# Patient Record
Sex: Male | Born: 1975 | Race: Black or African American | Hispanic: No | Marital: Married | Smoking: Never smoker
Health system: Southern US, Community
[De-identification: ages and names within clinical notes are randomized; demographics above are authoritative.]

## PROBLEM LIST (undated history)

## (undated) DIAGNOSIS — I10 Essential (primary) hypertension: Secondary | ICD-10-CM

## (undated) DIAGNOSIS — E669 Obesity, unspecified: Secondary | ICD-10-CM

## (undated) DIAGNOSIS — G4733 Obstructive sleep apnea (adult) (pediatric): Secondary | ICD-10-CM

## (undated) DIAGNOSIS — E785 Hyperlipidemia, unspecified: Secondary | ICD-10-CM

## (undated) HISTORY — DX: Essential (primary) hypertension: I10

## (undated) HISTORY — DX: Obstructive sleep apnea (adult) (pediatric): G47.33

## (undated) HISTORY — DX: Obesity, unspecified: E66.9

## (undated) HISTORY — DX: Hyperlipidemia, unspecified: E78.5

---

## 2006-06-02 ENCOUNTER — Emergency Department: Payer: Self-pay | Admitting: Unknown Physician Specialty

## 2008-03-09 ENCOUNTER — Emergency Department: Payer: Self-pay | Admitting: Internal Medicine

## 2008-03-09 ENCOUNTER — Other Ambulatory Visit: Payer: Self-pay

## 2008-09-19 ENCOUNTER — Emergency Department: Payer: Self-pay | Admitting: Emergency Medicine

## 2010-03-28 ENCOUNTER — Emergency Department: Payer: Self-pay | Admitting: Emergency Medicine

## 2010-06-29 ENCOUNTER — Emergency Department: Payer: Self-pay | Admitting: Emergency Medicine

## 2010-06-30 ENCOUNTER — Emergency Department: Payer: Self-pay | Admitting: Emergency Medicine

## 2011-02-20 ENCOUNTER — Emergency Department: Payer: Self-pay | Admitting: Emergency Medicine

## 2011-04-16 ENCOUNTER — Emergency Department: Payer: Self-pay | Admitting: Internal Medicine

## 2011-10-31 ENCOUNTER — Emergency Department: Payer: Self-pay | Admitting: Emergency Medicine

## 2012-05-19 ENCOUNTER — Emergency Department: Payer: Self-pay | Admitting: Emergency Medicine

## 2012-05-19 LAB — COMPREHENSIVE METABOLIC PANEL
Albumin: 3.7 g/dL (ref 3.4–5.0)
Alkaline Phosphatase: 69 U/L (ref 50–136)
BUN: 10 mg/dL (ref 7–18)
Bilirubin,Total: 0.3 mg/dL (ref 0.2–1.0)
Chloride: 108 mmol/L — ABNORMAL HIGH (ref 98–107)
Co2: 23 mmol/L (ref 21–32)
Creatinine: 0.89 mg/dL (ref 0.60–1.30)
EGFR (Non-African Amer.): >60
Glucose: 94 mg/dL (ref 65–99)
SGOT(AST): 22 U/L (ref 15–37)
SGPT (ALT): 37 U/L (ref 12–78)

## 2012-05-19 LAB — CK TOTAL AND CKMB (NOT AT ARMC)
CK, Total: 180 U/L (ref 35–232)
CK-MB: 0.9 ng/mL (ref 0.5–3.6)

## 2012-05-19 LAB — CBC
HGB: 15.1 g/dL (ref 13.0–18.0)
MCH: 29.3 pg (ref 26.0–34.0)
MCHC: 33.9 g/dL (ref 32.0–36.0)
Platelet: 204 10*3/uL (ref 150–440)
RBC: 5.15 10*6/uL (ref 4.40–5.90)
WBC: 8 10*3/uL (ref 3.8–10.6)

## 2012-05-19 LAB — TROPONIN I: Troponin-I: <0.02 ng/mL

## 2013-05-10 LAB — COMPREHENSIVE METABOLIC PANEL
Albumin: 3.6 g/dL (ref 3.4–5.0)
Alkaline Phosphatase: 73 U/L (ref 50–136)
Bilirubin,Total: 0.3 mg/dL (ref 0.2–1.0)
Calcium, Total: 8.7 mg/dL (ref 8.5–10.1)
Chloride: 107 mmol/L (ref 98–107)
Creatinine: 1.02 mg/dL (ref 0.60–1.30)
EGFR (African American): >60
EGFR (Non-African Amer.): >60
Glucose: 103 mg/dL — ABNORMAL HIGH (ref 65–99)
Osmolality: 280 (ref 275–301)
Sodium: 140 mmol/L (ref 136–145)

## 2013-05-10 LAB — TROPONIN I: Troponin-I: <0.02 ng/mL

## 2013-05-10 LAB — CBC
HCT: 43.7 % (ref 40.0–52.0)
MCHC: 32.7 g/dL (ref 32.0–36.0)
MCV: 86 fL (ref 80–100)
Platelet: 183 10*3/uL (ref 150–440)
RBC: 5.07 10*6/uL (ref 4.40–5.90)
RDW: 14.6 % — ABNORMAL HIGH (ref 11.5–14.5)
WBC: 7.9 10*3/uL (ref 3.8–10.6)

## 2013-05-11 ENCOUNTER — Observation Stay: Payer: Self-pay | Admitting: Internal Medicine

## 2013-05-14 ENCOUNTER — Encounter (HOSPITAL_COMMUNITY): Payer: Self-pay | Admitting: Emergency Medicine

## 2013-05-14 ENCOUNTER — Emergency Department (HOSPITAL_COMMUNITY)
Admission: EM | Admit: 2013-05-14 | Discharge: 2013-05-14 | Discharge status: Against Medical Advice | Payer: Self-pay | Attending: Emergency Medicine | Admitting: Emergency Medicine

## 2013-05-14 DIAGNOSIS — M79609 Pain in unspecified limb: Secondary | ICD-10-CM | POA: Insufficient documentation

## 2013-05-14 NOTE — ED Notes (Signed)
Pt left AMA. Provider made aware. Not able to obtain discharge vitals.

## 2013-05-14 NOTE — ED Notes (Signed)
Pts wife asking "what is the deal here?" Explained to pt and family that the provider is with another patient but will be with them as soon as possible. Pt resting comfortably. In NAD. Denies pain.

## 2013-05-14 NOTE — ED Provider Notes (Signed)
  Medical screening examination/treatment/procedure(s) were performed by non-physician practitioner and as supervising physician I was immediately available for consultation/collaboration.  EKG Interpretation   None          Desmon Hitchner, MD 05/14/13 2354 

## 2013-05-14 NOTE — ED Notes (Signed)
Since last Thursday, pt had a workup for tinging in pt left arm, TIA and stroke was r/o out at [facility] hospital, currently pt c/os of a tingling from his left elbow to left wrist, here today for futher evualtion

## 2013-05-14 NOTE — ED Notes (Signed)
The pt has had lt arm pain since last Thursday.  No known injury. He was seen at [place]  This past Monday and had everything done ekg echo lab work and xrays.  Not cardiac but the pt is here because he was not given a diagnosis

## 2013-05-14 NOTE — ED Provider Notes (Signed)
Pt left prior to evaluation.  I did not see or evaluate pt.  Garlon Hatchet, PA-C 05/14/13 2111

## 2013-05-15 ENCOUNTER — Encounter (HOSPITAL_COMMUNITY): Payer: Self-pay | Admitting: Emergency Medicine

## 2013-05-15 ENCOUNTER — Emergency Department (HOSPITAL_COMMUNITY)
Admission: EM | Admit: 2013-05-15 | Discharge: 2013-05-15 | Disposition: A | Discharge status: Home / Self Care | Payer: Self-pay | Attending: Emergency Medicine | Admitting: Emergency Medicine

## 2013-05-15 DIAGNOSIS — M79609 Pain in unspecified limb: Secondary | ICD-10-CM | POA: Insufficient documentation

## 2013-05-15 DIAGNOSIS — R209 Unspecified disturbances of skin sensation: Secondary | ICD-10-CM | POA: Insufficient documentation

## 2013-05-15 DIAGNOSIS — M79602 Pain in left arm: Secondary | ICD-10-CM

## 2013-05-15 MED ORDER — TRAMADOL HCL 50 MG PO TABS: 50.0000 mg | ORAL_TABLET | Freq: Four times a day (QID) | ORAL | Status: DC | PRN

## 2013-05-15 MED ORDER — NAPROXEN 500 MG PO TABS: 500.0000 mg | ORAL_TABLET | Freq: Two times a day (BID) | ORAL | Status: DC

## 2013-05-15 NOTE — ED Provider Notes (Signed)
CSN: [id number]     Arrival date & time 05/15/13  1339 History  This chart was scribed for non-physician practitioner Santiago Glad, PA-C working with Junius Argyle, MD by Leone Payor, ED Scribe. This patient was seen in room TR05C/TR05C and the patient's care was started at 1339.     Chief Complaint  Patient presents with  . Arm Pain    The history is provided by the patient. No language interpreter was used.     HPI Comments: Wyatt Mckinney is a 37 y.o. male who presents to the Emergency Department complaining of 6 days of gradual onset, gradually worsening, constant left arm pain. Pt states he was seen at [facility] for the left arm pain.  He reports that he had a full stroke work up and cardiac work up at that time.  He states that he had an ultrasound of his carotid arteries, EKG, Heart Echo, xrays of the left arm, and a CT scan of his head.  Pt states the pain was bearable but gradually worsened. Pt believes he may have a pinched nerve. Pt reports having intermittent numbness but is able to grip objects normally.  Denies weakness.  He denies any recent injuries or trauma. He has been taking 800 mg ibuprofen, last dose was about 7 hours ago. He states pain is worse when laying on the affected arm. He denies neck pain or chest pain.  No fever or chills.  No swelling or erythema of the arm.   History reviewed. No pertinent past medical history. History reviewed. No pertinent past surgical history. History reviewed. No pertinent family history. History  Substance Use Topics  . Smoking status: Never Smoker   . Smokeless tobacco: Not on file  . Alcohol Use: No    Review of Systems A complete 10 system review of systems was obtained and all systems are negative except as noted in the HPI and PMH.   Allergies  Review of patient's allergies indicates no known allergies.  Home Medications   Current Outpatient Rx  Name  Route  Sig  Dispense  Refill  . ibuprofen (ADVIL,MOTRIN) 800 MG  tablet   Oral   Take 800 mg by mouth every 8 (eight) hours as needed for moderate pain.          BP 140/78  Pulse 54  Temp(Src) 98.2 F (36.8 C) (Oral)  Resp 18  SpO2 96% Physical Exam  Nursing note and vitals reviewed. Constitutional: He is oriented to person, place, and time. He appears well-developed and well-nourished.  HENT:  Head: Normocephalic and atraumatic.  Mouth/Throat: Oropharynx is clear and moist.  Eyes: EOM are normal. Pupils are equal, round, and reactive to light.  Neck: Normal range of motion. Neck supple.  No tenderness to palpation of C spine.  Cardiovascular: Normal rate, regular rhythm and normal heart sounds.   Pulmonary/Chest: Effort normal and breath sounds normal. He has no wheezes.  Musculoskeletal: Normal range of motion.  Full ROM of left arm. No erythema, edema, or warmth.  Neurological: He is alert and oriented to person, place, and time. He has normal strength. He displays normal reflexes. No sensory deficit. He exhibits normal muscle tone. Coordination normal.  Muscle strength is normal. Distal sensation of the fingers of the left hand is intact.  Skin: Skin is warm and dry.  Psychiatric: He has a normal mood and affect. His behavior is normal.    ED Course  Procedures   DIAGNOSTIC STUDIES: Oxygen Saturation is 96% on  RA, adequate by my interpretation.    COORDINATION OF CARE: 2:36 PM Discussed treatment plan with pt at bedside and pt agreed to plan.   Labs Review Labs Reviewed - No data to display Imaging Review No results found.  EKG Interpretation   None       MDM  No diagnosis found. Patient presenting with pain of his left arm that has been constant for the past 6 days.  He reports that he had a previous work up at [facility], which included a work up for CVA and also a cardiac work up, which he reports was negative.  No signs of infection of the arm.  No injury or trauma.  No chest pain.  Pain most likely cervical radiculopathy.   Feel that the patient is stable for discharge due to the fact that the pain has been constant for six days.  Return precautions given.  I personally performed the services described in this documentation, which was scribed in my presence. The recorded information has been reviewed and is accurate.   Santiago Glad, PA-C 05/16/13 1029

## 2013-05-15 NOTE — ED Notes (Signed)
Pt reports he was seen at [facility] last week for L arm pain and "they did a full work up: xrays, ct scan, and a heart echo." states pain persists.  States they gave him no pain meds to take at home and no instructions for f/u.

## 2013-05-16 NOTE — ED Provider Notes (Signed)
Medical screening examination/treatment/procedure(s) were performed by non-physician practitioner and as supervising physician I was immediately available for consultation/collaboration.  EKG Interpretation   None         Junius Argyle, MD 05/16/13 [id number]

## 2014-06-19 ENCOUNTER — Emergency Department (HOSPITAL_COMMUNITY): Payer: Self-pay

## 2014-06-19 ENCOUNTER — Encounter (HOSPITAL_COMMUNITY): Payer: Self-pay | Admitting: *Deleted

## 2014-06-19 ENCOUNTER — Emergency Department (HOSPITAL_COMMUNITY)
Admission: EM | Admit: 2014-06-19 | Discharge: 2014-06-19 | Disposition: A | Discharge status: Home / Self Care | Payer: Self-pay | Attending: Emergency Medicine | Admitting: Emergency Medicine

## 2014-06-19 DIAGNOSIS — R079 Chest pain, unspecified: Secondary | ICD-10-CM | POA: Insufficient documentation

## 2014-06-19 DIAGNOSIS — Z791 Long term (current) use of non-steroidal anti-inflammatories (NSAID): Secondary | ICD-10-CM | POA: Insufficient documentation

## 2014-06-19 DIAGNOSIS — R109 Unspecified abdominal pain: Secondary | ICD-10-CM | POA: Insufficient documentation

## 2014-06-19 DIAGNOSIS — R0789 Other chest pain: Secondary | ICD-10-CM

## 2014-06-19 LAB — CBC
HEMATOCRIT: 43.3 % (ref 39.0–52.0)
HEMOGLOBIN: 13.8 g/dL (ref 13.0–17.0)
MCH: 27.8 pg (ref 26.0–34.0)
MCHC: 31.9 g/dL (ref 30.0–36.0)
MCV: 87.3 fL (ref 78.0–100.0)
Platelets: 184 10*3/uL (ref 150–400)
RBC: 4.96 MIL/uL (ref 4.22–5.81)
RDW: 13.9 % (ref 11.5–15.5)
WBC: 6.8 10*3/uL (ref 4.0–10.5)

## 2014-06-19 LAB — BASIC METABOLIC PANEL
Anion gap: 14 (ref 5–15)
BUN: 13 mg/dL (ref 6–23)
CO2: 22 mEq/L (ref 19–32)
CREATININE: 0.91 mg/dL (ref 0.50–1.35)
Calcium: 8.8 mg/dL (ref 8.4–10.5)
Chloride: 103 mEq/L (ref 96–112)
GFR calc Af Amer: >90 mL/min (ref >90)
GLUCOSE: 88 mg/dL (ref 70–99)
POTASSIUM: 4.1 meq/L (ref 3.7–5.3)
Sodium: 139 mEq/L (ref 137–147)

## 2014-06-19 LAB — I-STAT TROPONIN, ED: Troponin i, poc: 0 ng/mL (ref 0.00–0.08)

## 2014-06-19 MED ORDER — IBUPROFEN 800 MG PO TABS
800.0000 mg | ORAL_TABLET | Freq: Once | ORAL | Status: AC
  Administered 2014-06-19: 800 mg via ORAL
  Filled 2014-06-19: qty 1

## 2014-06-19 MED ORDER — TRAMADOL HCL 50 MG PO TABS: 50.0000 mg | ORAL_TABLET | Freq: Four times a day (QID) | ORAL | Status: DC | PRN

## 2014-06-19 MED ORDER — NAPROXEN 500 MG PO TABS: 500.0000 mg | ORAL_TABLET | Freq: Two times a day (BID) | ORAL | Status: DC

## 2014-06-19 NOTE — Discharge Instructions (Signed)
Please follow up with your primary care physician in 1-2 days. If you do not have one please call the [facility] listed above. Please take pain medication and/or muscle relaxants as prescribed and as needed for pain. Please do not drive on narcotic pain medication or on muscle relaxants. Please read all discharge instructions and return precautions.     Chest Pain (Nonspecific) It is often hard to give a specific diagnosis for the cause of chest pain. There is always a chance that your pain could be related to something serious, such as a heart attack or a blood clot in the lungs. You need to follow up with your health care provider for further evaluation. CAUSES   Heartburn.  Pneumonia or bronchitis.  Anxiety or stress.  Inflammation around your heart (pericarditis) or lung (pleuritis or pleurisy).  A blood clot in the lung.  A collapsed lung (pneumothorax). It can develop suddenly on its own (spontaneous pneumothorax) or from trauma to the chest.  Shingles infection (herpes zoster virus). The chest wall is composed of bones, muscles, and cartilage. Any of these can be the source of the pain.  The bones can be bruised by injury.  The muscles or cartilage can be strained by coughing or overwork.  The cartilage can be affected by inflammation and become sore (costochondritis). DIAGNOSIS  Lab tests or other studies may be needed to find the cause of your pain. Your health care provider may have you take a test called an ambulatory electrocardiogram (ECG). An ECG records your heartbeat patterns over a 24-hour period. You may also have other tests, such as:  Transthoracic echocardiogram (TTE). During echocardiography, sound waves are used to evaluate how blood flows through your heart.  Transesophageal echocardiogram (TEE).  Cardiac monitoring. This allows your health care provider to monitor your heart rate and rhythm in real time.  Holter monitor. This  is a portable device that records your heartbeat and can help diagnose heart arrhythmias. It allows your health care provider to track your heart activity for several days, if needed.  Stress tests by exercise or by giving medicine that makes the heart beat faster. TREATMENT   Treatment depends on what may be causing your chest pain. Treatment may include:  Acid blockers for heartburn.  Anti-inflammatory medicine.  Pain medicine for inflammatory conditions.  Antibiotics if an infection is present.  You may be advised to change lifestyle habits. This includes stopping smoking and avoiding alcohol, caffeine, and chocolate.  You may be advised to keep your head raised (elevated) when sleeping. This reduces the chance of acid going backward from your stomach into your esophagus. Most of the time, nonspecific chest pain will improve within 2-3 days with rest and mild pain medicine.  HOME CARE INSTRUCTIONS   If antibiotics were prescribed, take them as directed. Finish them even if you start to feel better.  For the next few days, avoid physical activities that bring on chest pain. Continue physical activities as directed.  Do not use any tobacco products, including cigarettes, chewing tobacco, or electronic cigarettes.  Avoid drinking alcohol.  Only take medicine as directed by your health care provider.  Follow your health care provider's suggestions for further testing if your chest pain does not go away.  Keep any follow-up appointments you made. If you do not go to an appointment, you could develop lasting (chronic) problems with pain. If there is any problem keeping an appointment, call to reschedule. SEEK MEDICAL CARE IF:  Your chest pain does not go away, even after treatment.  You have a rash with blisters on your chest.  You have a fever. SEEK IMMEDIATE MEDICAL CARE IF:   You have increased chest pain or pain that spreads to your arm, neck, jaw, back, or  abdomen.  You have shortness of breath.  You have an increasing cough, or you cough up blood.  You have severe back or abdominal pain.  You feel nauseous or vomit.  You have severe weakness.  You faint.  You have chills. This is an emergency. Do not wait to see if the pain will go away. Get medical help at once. Call your local emergency services (911 in [place] Do not drive yourself to the hospital. MAKE SURE YOU:   Understand these instructions.  Will watch your condition.  Will get help right away if you are not doing well or get worse. Document Released: 03/27/2005 Document Revised: 06/22/2013 Document Reviewed: 01/21/2008 [facility] Patient Information 2015 [employer] [place]. This information is not intended to replace advice given to you by your health care provider. Make sure you discuss any questions you have with your health care provider.

## 2014-06-19 NOTE — ED Provider Notes (Signed)
CSN: [id number]     Arrival date & time 06/19/14  1922 History   First MD Initiated Contact with Patient 06/19/14 2004     Chief Complaint  Patient presents with  . Chest Pain  . Abdominal Pain     (Consider location/radiation/quality/duration/timing/severity/associated sxs/prior Treatment) HPI Comments: Patient is a 38 yo M without chronic medical problems presenting to the ED for intermittent episode of left sided chest pain. He states the pain lasts only a few seconds and describes it as sore. He states movement aggravates his pain. No modifying factors identified. States he has had similar pain in the past but has never been seen for before. He states he had one instance of abdominal pain last evening after eating something is had no further pain at this time. He denies any associated fevers, chills, cough, nasal congestion, rhinorrhea, shortness of breath, nausea, vomiting or diarrhea. No early familial cardiac history. PERC negative.   Patient is a 38 y.o. male presenting with chest pain and abdominal pain.  Chest Pain Associated symptoms: abdominal pain   Abdominal Pain Associated symptoms: chest pain     History reviewed. No pertinent past medical history. History reviewed. No pertinent past surgical history. History reviewed. No pertinent family history. History  Substance Use Topics  . Smoking status: Never Smoker   . Smokeless tobacco: Not on file  . Alcohol Use: No    Review of Systems  Cardiovascular: Positive for chest pain.  Gastrointestinal: Positive for abdominal pain.  All other systems reviewed and are negative.     Allergies  Review of patient's allergies indicates no known allergies.  Home Medications   Prior to Admission medications   Medication Sig Start Date End Date Taking? Authorizing Provider  ibuprofen (ADVIL,MOTRIN) 800 MG tablet Take 800 mg by mouth every 8 (eight) hours as needed for moderate pain.    Historical Provider, MD  naproxen  (NAPROSYN) 500 MG tablet Take 1 tablet (500 mg total) by mouth 2 (two) times daily. 05/15/13   Heather Laisure, PA-C  naproxen (NAPROSYN) 500 MG tablet Take 1 tablet (500 mg total) by mouth 2 (two) times daily with a meal. 06/19/14   Carlee Vonderhaar L Shaquan Missey, PA-C  traMADol (ULTRAM) 50 MG tablet Take 1 tablet (50 mg total) by mouth every 6 (six) hours as needed. 05/15/13   Santiago GladHeather Laisure, PA-C  traMADol (ULTRAM) 50 MG tablet Take 1 tablet (50 mg total) by mouth every 6 (six) hours as needed. 06/19/14   Meredyth Hornung L Samnang Shugars, PA-C   BP 151/86 mmHg  Pulse 98  Temp(Src) 98.7 F (37.1 C) (Oral)  Resp 24  Ht 5\' 8"  (1.727 m)  Wt 348 lb (157.852 kg)  BMI 52.93 kg/m2  SpO2 98% Physical Exam  Constitutional: He is oriented to person, place, and time. He appears well-developed and well-nourished. No distress.  HENT:  Head: Normocephalic and atraumatic.  Right Ear: External ear normal.  Left Ear: External ear normal.  Nose: Nose normal.  Mouth/Throat: Oropharynx is clear and moist.  Eyes: Conjunctivae are normal.  Neck: Normal range of motion. Neck supple.  Cardiovascular: Normal rate, regular rhythm and normal heart sounds.   Pulmonary/Chest: Effort normal and breath sounds normal. No respiratory distress.  Abdominal: Soft. There is no tenderness.  Musculoskeletal: Normal range of motion. He exhibits no edema.  Neurological: He is alert and oriented to person, place, and time.  Skin: Skin is warm and dry. He is not diaphoretic.  Psychiatric: He has a normal mood and affect.  Nursing note and vitals reviewed.   ED Course  Procedures (including critical care time) Medications  ibuprofen (ADVIL,MOTRIN) tablet 800 mg (800 mg Oral Given 06/19/14 2122)    Labs Review Labs Reviewed  CBC  BASIC METABOLIC PANEL  I-STAT TROPOININ, ED    Imaging Review Dg Chest 2 View  06/19/2014   CLINICAL DATA:  Acute onset of left-sided chest pain, intermittent in nature. Initial encounter.  EXAM:  CHEST  2 VIEW  COMPARISON:  Chest radiograph performed 05/10/2013  FINDINGS: The lungs are mildly hypoexpanded. Mild vascular crowding and vascular congestion or noted. There is no evidence of focal opacification, pleural effusion or pneumothorax.  The heart is borderline normal in size; the mediastinal contour is within normal limits. No acute osseous abnormalities are seen.  IMPRESSION: Lungs mildly hypoexpanded but grossly clear. Mild vascular congestion seen.   Electronically Signed   By: Roanna RaiderJeffery  Chang M.D.   On: 06/19/2014 23:02     EKG Interpretation None      MDM   Final diagnoses:  Chest pain, atypical    Filed Vitals:   06/19/14 2320  BP: 151/86  Pulse: 98  Temp: 98.7 F (37.1 C)  Resp: 24   Afebrile, NAD, non-toxic appearing, AAOx4.  Patient is to be discharged with recommendation to follow up with PCP in regards to today's hospital visit. Chest pain is not likely of cardiac or pulmonary etiology d/t presentation, perc negative, VSS, no tracheal deviation, no JVD or new murmur, RRR, breath sounds equal bilaterally, EKG without acute abnormalities, negative troponin, and negative CXR. Pt has been advised start to return to the ED is CP becomes exertional, associated with diaphoresis or nausea, radiates to left jaw/arm, worsens or becomes concerning in any way. Pt appears reliable for follow up and is agreeable to discharge. Patient is stable at time of discharge      Jeannetta EllisJennifer L Cassell Voorhies, PA-C 06/20/14 0820  Flint MelterElliott L Wentz, MD 06/20/14 1037

## 2014-06-19 NOTE — ED Notes (Signed)
Pt in c/o episode of left chest pain that happened about 45 min ago, pain has resolved, denies other symptoms, also reports abd pain last night but he thinks he ate something that made it hurt, pain has resolved at this time, no complaints at this time

## 2014-06-23 ENCOUNTER — Emergency Department (HOSPITAL_COMMUNITY): Payer: Self-pay

## 2014-06-23 ENCOUNTER — Encounter (HOSPITAL_COMMUNITY): Payer: Self-pay | Admitting: Emergency Medicine

## 2014-06-23 ENCOUNTER — Emergency Department (HOSPITAL_COMMUNITY): Payer: No Typology Code available for payment source

## 2014-06-23 ENCOUNTER — Emergency Department (HOSPITAL_COMMUNITY)
Admission: EM | Admit: 2014-06-23 | Discharge: 2014-06-23 | Disposition: A | Discharge status: Home / Self Care | Payer: Self-pay | Attending: Emergency Medicine | Admitting: Emergency Medicine

## 2014-06-23 DIAGNOSIS — Z79899 Other long term (current) drug therapy: Secondary | ICD-10-CM | POA: Insufficient documentation

## 2014-06-23 DIAGNOSIS — R1084 Generalized abdominal pain: Secondary | ICD-10-CM | POA: Insufficient documentation

## 2014-06-23 DIAGNOSIS — Z791 Long term (current) use of non-steroidal anti-inflammatories (NSAID): Secondary | ICD-10-CM | POA: Insufficient documentation

## 2014-06-23 LAB — COMPREHENSIVE METABOLIC PANEL
ALT: 28 U/L (ref 0–53)
ANION GAP: 7 (ref 5–15)
AST: 32 U/L (ref 0–37)
Albumin: 3.4 g/dL — ABNORMAL LOW (ref 3.5–5.2)
Alkaline Phosphatase: 43 U/L (ref 39–117)
BILIRUBIN TOTAL: 1.2 mg/dL (ref 0.3–1.2)
BUN: 16 mg/dL (ref 6–23)
CHLORIDE: 107 meq/L (ref 96–112)
CO2: 21 mmol/L (ref 19–32)
CREATININE: 1.05 mg/dL (ref 0.50–1.35)
Calcium: 8.3 mg/dL — ABNORMAL LOW (ref 8.4–10.5)
GFR calc non Af Amer: 89 mL/min — ABNORMAL LOW (ref >90)
GLUCOSE: 129 mg/dL — AB (ref 70–99)
Potassium: 4.8 mmol/L (ref 3.5–5.1)
Sodium: 135 mmol/L (ref 135–145)
Total Protein: 6.6 g/dL (ref 6.0–8.3)

## 2014-06-23 LAB — CBC WITH DIFFERENTIAL/PLATELET
Basophils Absolute: 0 10*3/uL (ref 0.0–0.1)
Basophils Relative: 1 % (ref 0–1)
Eosinophils Absolute: 0.2 10*3/uL (ref 0.0–0.7)
Eosinophils Relative: 4 % (ref 0–5)
HEMATOCRIT: 43.7 % (ref 39.0–52.0)
HEMOGLOBIN: 14.4 g/dL (ref 13.0–17.0)
LYMPHS PCT: 30 % (ref 12–46)
Lymphs Abs: 1.7 10*3/uL (ref 0.7–4.0)
MCH: 29.1 pg (ref 26.0–34.0)
MCHC: 33 g/dL (ref 30.0–36.0)
MCV: 88.5 fL (ref 78.0–100.0)
MONO ABS: 0.2 10*3/uL (ref 0.1–1.0)
MONOS PCT: 4 % (ref 3–12)
Neutro Abs: 3.4 10*3/uL (ref 1.7–7.7)
Neutrophils Relative %: 61 % (ref 43–77)
Platelets: 201 10*3/uL (ref 150–400)
RBC: 4.94 MIL/uL (ref 4.22–5.81)
RDW: 14.2 % (ref 11.5–15.5)
WBC: 5.5 10*3/uL (ref 4.0–10.5)

## 2014-06-23 LAB — URINALYSIS, ROUTINE W REFLEX MICROSCOPIC
Bilirubin Urine: NEGATIVE
GLUCOSE, UA: NEGATIVE mg/dL
HGB URINE DIPSTICK: NEGATIVE
KETONES UR: NEGATIVE mg/dL
Leukocytes, UA: NEGATIVE
Nitrite: NEGATIVE
PROTEIN: NEGATIVE mg/dL
Specific Gravity, Urine: >1.046 — ABNORMAL HIGH (ref 1.005–1.030)
Urobilinogen, UA: 0.2 mg/dL (ref 0.0–1.0)
pH: 5 (ref 5.0–8.0)

## 2014-06-23 LAB — LIPASE, BLOOD: LIPASE: 30 U/L (ref 11–59)

## 2014-06-23 LAB — I-STAT TROPONIN, ED: Troponin i, poc: 0 ng/mL (ref 0.00–0.08)

## 2014-06-23 MED ORDER — ONDANSETRON HCL 4 MG/2ML IJ SOLN
4.0000 mg | Freq: Once | INTRAMUSCULAR | Status: AC
  Administered 2014-06-23: 4 mg via INTRAVENOUS
  Filled 2014-06-23: qty 2

## 2014-06-23 MED ORDER — HYDROCODONE-ACETAMINOPHEN 5-325 MG PO TABS: 2.0000 | ORAL_TABLET | ORAL | Status: DC | PRN

## 2014-06-23 MED ORDER — IOHEXOL 300 MG/ML  SOLN
25.0000 mL | Freq: Once | INTRAMUSCULAR | Status: AC | PRN
  Administered 2014-06-23: 25 mL via ORAL

## 2014-06-23 MED ORDER — IOHEXOL 300 MG/ML  SOLN
100.0000 mL | Freq: Once | INTRAMUSCULAR | Status: AC | PRN
  Administered 2014-06-23: 100 mL via INTRAVENOUS

## 2014-06-23 MED ORDER — MORPHINE SULFATE 4 MG/ML IJ SOLN
4.0000 mg | Freq: Once | INTRAMUSCULAR | Status: AC
  Administered 2014-06-23: 4 mg via INTRAVENOUS
  Filled 2014-06-23: qty 1

## 2014-06-23 NOTE — Discharge Instructions (Signed)
Abdominal Pain °Many things can cause abdominal pain. Usually, abdominal pain is not caused by a disease and will improve without treatment. It can often be observed and treated at home. Your health care provider will do a physical exam and possibly order blood tests and X-rays to help determine the seriousness of your pain. However, in many cases, more time must pass before a clear cause of the pain can be found. Before that point, your health care provider may not know if you need more testing or further treatment. °HOME CARE INSTRUCTIONS  °Monitor your abdominal pain for any changes. The following actions may help to alleviate any discomfort you are experiencing: °· Only take over-the-counter or prescription medicines as directed by your health care provider. °· Do not take laxatives unless directed to do so by your health care provider. °· Try a clear liquid diet (broth, tea, or water) as directed by your health care provider. Slowly move to a bland diet as tolerated. °SEEK MEDICAL CARE IF: °· You have unexplained abdominal pain. °· You have abdominal pain associated with nausea or diarrhea. °· You have pain when you urinate or have a bowel movement. °· You experience abdominal pain that wakes you in the night. °· You have abdominal pain that is worsened or improved by eating food. °· You have abdominal pain that is worsened with eating fatty foods. °· You have a fever. °SEEK IMMEDIATE MEDICAL CARE IF:  °· Your pain does not go away within 2 hours. °· You keep throwing up (vomiting). °· Your pain is felt only in portions of the abdomen, such as the right side or the left lower portion of the abdomen. °· You pass bloody or black tarry stools. °MAKE SURE YOU: °· Understand these instructions.   °· Will watch your condition.   °· Will get help right away if you are not doing well or get worse.   °Document Released: 03/27/2005 Document Revised: 06/22/2013 Document Reviewed: 02/24/2013 °ExitCare® Patient Information  ©2015 [employer]. This information is not intended to replace advice given to you by your health care provider. Make sure you discuss any questions you have with your health care provider. ° ° °Emergency Department Resource Guide °1) Find a Doctor and Pay Out of Pocket °Although you won't have to find out who is covered by your insurance plan, it is a good idea to ask around and get recommendations. You will then need to call the office and see if the doctor you have chosen will accept you as a new patient and what types of options they offer for patients who are self-pay. Some doctors offer discounts or will set up payment plans for their patients who do not have insurance, but you will need to ask so you aren't surprised when you get to your appointment. ° °2) Contact Your Local Health Department °Not all health departments have doctors that can see patients for sick visits, but many do, so it is worth a call to see if yours does. If you don't know where your local health department is, you can check in your phone book. The CDC also has a tool to help you locate your state's health department, and many state websites also have listings of all of their local health departments. ° °3) Find a Walk-in Clinic °If your illness is not likely to be very severe or complicated, you may want to try a walk in clinic. These are popping up all over the country in pharmacies, drugstores, and shopping centers. They're   usually staffed by nurse practitioners or physician assistants that have been trained to treat common illnesses and complaints. They're usually fairly quick and inexpensive. However, if you have serious medical issues or chronic medical problems, these are probably not your best option. ° °No Primary Care Doctor: °- Call [facility] at  832-8000 - they can help you locate a primary care doctor that  accepts your insurance, provides certain services, etc. °- Physician Referral Service- 1-800-533-3463 ° °Chronic  Pain Problems: °Organization         Address  Phone   Notes  °[facility]  (336) 297-2271 Patients need to be referred by their primary care doctor.  ° °Medication Assistance: °Organization         Address  Phone   Notes  °[place] [facility] [address] °[place], [place] [address] (336) 641-8030 --Must be a resident of [place] °-- Must have NO insurance coverage whatsoever (no Medicaid/ Medicare, etc.) °-- The pt. MUST have a primary care doctor that directs their care regularly and follows them in the community °  °[facility]  (866) 331-1348   °[employer]  (888) 892-1162   ° °Agencies that provide inexpensive medical care: °Organization         Address  Phone   Notes  °[facility]  (336) 832-8035   °[facility]    (336) 832-7272   °[facility] [address] °[place], [place] [address] (336) 832-4777   °[facility] [address], °[place] (336) 271-4999   °[facility]    (336) 373-0678   °[facility]    (336) 272-1050   °[facility] ° [address], [place] Phone:  (336) 832-4444, Fax:  (336) 832-4440 Hours of Operation:  9 am - 6 pm, M-F.  Also accepts Medicaid/Medicare and self-pay.  °[facility] ° [address], [place] Phone: (336) 832-3150, Fax: (336) 832-3151. Hours of Operation:  8:30 am - 5:30 pm, M-F.  Also accepts Medicaid and self-pay.  °[facility] [place] [address], [place] Phone: (336) 878-6027   °[facility] [address], [place], [place] (336)723-1848, Ext. 123 Mondays & Thursdays: 7-9 AM.  First 15 patients are seen on a first come, first serve basis. °  ° °Medicaid-accepting [place] Providers: ° °Organization         Address  Phone   Notes  °[facility] [address], [place] (336) 641-2100 Also  accepts self-pay patients.  °[facility] [address], [place] ° (336) 856-9996   °[facility] [address], [place] (336) 288-8857   °[facility] [address] [place] [address], [place] (336) 299-7000   °Veita Bland [address], [place]  ° (336) 373-1557 Only accepts [place] Access Medicaid patients after they have their name applied to their card.  ° °Self-Pay (no insurance) in [place]: ° °Organization         Address  Phone   Notes  °Sickle Cell Patients, [facility] [address], [place] (336) 832-1970   °[facility] [address],  (336) 832-4400   °[facility] ° [address], [place] (336) 992-4800   °[facility]/Dr. Osei-Bonsu ° [address] [place] [address], [place] or [address], [place] (336) 841-8500 Phone number for both [place] and [place] locations is the same.  °[facility] [address], [place] (336) 299-0000   °[facility] [address] [place] [address], [place] or [address] (336) 852-7530 °(336) 878-2260   °[facility] [address], [place] (336) 350-1642, phone; (336) 294-5005, fax Sees patients 1st and 3rd Saturday of every month.  Must not qualify for public or private insurance (i.e. Medicaid, Medicare, [place] Health Choice, Veterans' Benefits) • Household income should be no more than 200% of the poverty level •The clinic cannot treat you if you are pregnant or think you are pregnant • Sexually transmitted diseases are not treated at the clinic.  ° ° °Dental Care: °Organization         Address  Phone  Notes  °[facility] [address], [place] (336) 641-6152 Accepts children up to age 21 who are enrolled in Medicaid or [place] Health Choice; pregnant  women with a Medicaid card; and children who have applied for Medicaid or [place] Health Choice, but were declined, whose parents can pay a reduced fee at time of service.  °[facility] [place]  [address], [place] (336) 641-7733 Accepts children up to age 21 who are enrolled in Medicaid or [place] Health Choice; pregnant women with a Medicaid card; and children who have applied for Medicaid or [place] Health Choice, but were declined, whose parents can pay a reduced fee at time of service.  °[facility] ° [address], [place] (336) 641-4533 Patients are seen by appointment only. Walk-ins are not accepted. [facility] will see patients 18 years of age and older. °Monday - Tuesday (8am-5pm) °Most Wednesdays (8:30-5pm) °$30 per visit, cash only  °[facility] ° [address], [place] (336) 641-4533 Patients are seen by appointment only. Walk-ins are not accepted. [facility] will see patients 18 years of age and older. °One Wednesday Evening (Monthly: Volunteer Based).  $30 per visit, cash only  °[facility]  (919) 537-3737 for adults; Children under age 4, call Graduate Pediatric Dentistry at (919) [phone]. Children aged 4-14, please call (919) [phone] to request a pediatric application. ° Dental services are provided in all areas of dental care including fillings, crowns and bridges, complete and partial dentures, implants, gum treatment, root canals, and extractions. Preventive care is also provided. Treatment is provided to both adults and children. °Patients are selected via a lottery and there is often a waiting list. °  °[facility] [address], ° ° (336) 763-8833 [email or web address] °  °[facility] [address], [place], [place] (336)723-1848, Ext. 123 Second and Fourth Thursday of each month, opens at 6:30 AM; Clinic ends at 9 AM.  Patients are  seen on a first-come first-served basis, and a limited number are seen during each clinic.  ° °[facility] ° [address], [place], [place] (336) 723-7904   Eligibility Requirements °You must have lived in [place], [place], or [place] counties for at least the last three months. °  You cannot be eligible for state or federal sponsored healthcare insurance, including Veterans Administration, Medicaid, or Medicare. °  You generally cannot be eligible for healthcare insurance through   your employer.  °  How to apply: °Eligibility screenings are held every Tuesday and Wednesday afternoon from 1:00 pm until 4:00 pm. You do not need an appointment for the interview!  °[facility] [address], [place], [place] 336-631-2330   °[facility]  336-342-8273   °[facility]  336-703-3100   °[facility]  336-570-6415   ° °Behavioral Health Resources in the Community: °Intensive Outpatient Programs °Organization         Address  Phone  Notes  °[facility] [address], [place], [place] 336-878-6098   °[facility] [address], [place], [place] 336-832-9800   °[facility] [address], [place], [place] ° 336-882-2125   °[facility] [address],  °[place], [place] 1-800-853-5163 or 336-641-4981   °Substance Abuse Resources °Organization         Address  Phone  Notes  °[facility]  336-882-2125   °[facility]  336-784-9470   °[facility]  336-285-9073   °[facility]  336-845-3988   °Residential & Outpatient Substance Abuse Program  1-800-659-3381   °Psychological Services °Organization         Address  Phone  Notes  °[facility]  336- 832-9600   °[facility]  336- 378-7881   °[facility] [address], [place] 1-800-853-5163 or 336-641-4981   ° °Mobile Crisis  Teams °Organization         Address  Phone  Notes  °[facility], Mobile Crisis Care Unit  1-877-626-1772   °Assertive °[facility] ° [address] [place], [place] 336-834-9664   °Sharon DeEsch [address] °[place] 336-554-5454   ° °Self-Help/Support Groups °Organization         Address  Phone             Notes  °[facility] - variety of support groups  336- 373-1402 Call for more information  °Narcotics Anonymous (NA), [facility] [address], °[place]  2 meetings at this location  ° °Residential Treatment Programs °Organization         Address  Phone  Notes  °[facility] [address],    °[place]  1-866-801-8205   °[facility] ° [address], [place], [place] 704-293-8524   °[facility] [address], [place] 336-845-3988 Admissions: 8am-3pm M-F  °[facility] [address]    °[place], [place] 336-841-1104   °[facility] [address], [place], [place] 336-379-7146   °[facility] [address]  °[place], [place] 336-285-9073   °[facility] Intensive Outpatient [address], [place], [place] 336-852-3033   °[facility] [address]  °[place], [place] 1-877-615-2722 or 336-784-9470   °[facility] [address] [place], [place] 336-227-7417 Accepts Medicaid  °[facility] [address]  °[place] 1-800-659-3381 Substance Abuse/Addiction Treatment  ° °[place] Behavioral Health Resources °Organization         Address  Phone  Notes  °[facility]  (888) 581-9988   °Julie Brannon, PhD [address] [place], [place]   (336) 349-5553 or (336) 951-0000   °[facility]   [address] °[place],  (336) 349-4454   °  [facility] [address], [place], [place] (336) 342-8316  Insurance/Medicaid/sponsorship through [facility]  °[facility] [address]                                    [place], [place] (336) 342-8316 Therapy/tele-psych/case  °[facility] [address]  ° [place], [place] (336) 349-2233    °Dr. Arfeen  (336) 349-4544   °[facility] [place]  [employer] [place] [facility] 1) [address], [place] °2) [address], [place] °3)  [address], [place] (336) 349-3220 °(336) 342-7768 ° °(336) 342-8140   °[place] [facility] (336) 342-1394 or (336) 342-3537 (After Hours)    ° ° ° ° °

## 2014-06-23 NOTE — ED Provider Notes (Signed)
CSN: [id number]     Arrival date & time 06/23/14  [id number] History   First MD Initiated Contact with Patient 06/23/14 [phone]     Chief Complaint  Patient presents with  . Abdominal Pain     (Consider location/radiation/quality/duration/timing/severity/associated sxs/prior Treatment) HPI Comments: Pt c/o generalized abdominal pain for the last 4-5 days. Denies n/v/d. Has taking tums and ibuprofen without relief. No fever.states that he radiates to his left lower ribs. Pt states that he was seen 2 days ago for the abdominal pain. Denies being a big drinker. No surgeries on abdomen.  The history is provided by the patient. No language interpreter was used.    History reviewed. No pertinent past medical history. History reviewed. No pertinent past surgical history. No family history on file. History  Substance Use Topics  . Smoking status: Never Smoker   . Smokeless tobacco: Not on file  . Alcohol Use: No    Review of Systems  All other systems reviewed and are negative.     Allergies  Review of patient's allergies indicates no known allergies.  Home Medications   Prior to Admission medications   Medication Sig Start Date End Date Taking? Authorizing Provider  calcium carbonate (TUMS - DOSED IN MG ELEMENTAL CALCIUM) 500 MG chewable tablet Chew 2 tablets by mouth as needed for indigestion or heartburn.   Yes Historical Provider, MD  ibuprofen (ADVIL,MOTRIN) 800 MG tablet Take 800 mg by mouth every 8 (eight) hours as needed for moderate pain.   Yes Historical Provider, MD  naproxen (NAPROSYN) 500 MG tablet Take 1 tablet (500 mg total) by mouth 2 (two) times daily. 05/15/13   Heather Laisure, PA-C  naproxen (NAPROSYN) 500 MG tablet Take 1 tablet (500 mg total) by mouth 2 (two) times daily with a meal. 06/19/14   Jennifer L Piepenbrink, PA-C  traMADol (ULTRAM) 50 MG tablet Take 1 tablet (50 mg total) by mouth every 6 (six) hours as needed. 05/15/13   Santiago GladHeather Laisure, PA-C  traMADol  (ULTRAM) 50 MG tablet Take 1 tablet (50 mg total) by mouth every 6 (six) hours as needed. 06/19/14   Jennifer L Piepenbrink, PA-C   BP 129/69 mmHg  Pulse 64  Temp(Src) 98.9 F (37.2 C) (Oral)  Resp 16  Ht 5' 2.5" (1.588 m)  Wt 341 lb (154.677 kg)  BMI 61.34 kg/m2  SpO2 97% Physical Exam  Constitutional: He is oriented to person, place, and time. He appears well-developed and well-nourished.  Cardiovascular: Normal rate and regular rhythm.   Pulmonary/Chest: Effort normal and breath sounds normal.  Abdominal: Soft. Bowel sounds are normal.  Periumbilical tenderness  Musculoskeletal: Normal range of motion.  Neurological: He is alert and oriented to person, place, and time.  Skin: Skin is warm and dry.  Psychiatric: He has a normal mood and affect.  Nursing note and vitals reviewed.   ED Course  Procedures (including critical care time) Labs Review Labs Reviewed  COMPREHENSIVE METABOLIC PANEL - Abnormal; Notable for the following:    Glucose, Bld 129 (*)    Calcium 8.3 (*)    Albumin 3.4 (*)    GFR calc non Af Amer 89 (*)    All other components within normal limits  URINALYSIS, ROUTINE W REFLEX MICROSCOPIC - Abnormal; Notable for the following:    Specific Gravity, Urine >1.046 (*)    All other components within normal limits  CBC WITH DIFFERENTIAL  LIPASE, BLOOD  I-STAT TROPOININ, ED    Imaging Review Ct Abdomen Pelvis W  Contrast  06/23/2014   CLINICAL DATA:  Generalized abdominal pain for 4 days  EXAM: CT ABDOMEN AND PELVIS WITH CONTRAST  TECHNIQUE: Multidetector CT imaging of the abdomen and pelvis was performed using the standard protocol following bolus administration of intravenous contrast.  CONTRAST:  25mL OMNIPAQUE IOHEXOL 300 MG/ML SOLN, 100mL OMNIPAQUE IOHEXOL 300 MG/ML SOLN  COMPARISON:  None.  FINDINGS: Lung bases are unremarkable. There are streaky artifacts from patient's large body habitus. Heart size within normal limits.  Minimal degenerative changes  lower thoracic spine.  Mild hepatic fatty infiltration. No focal hepatic mass. No calcified gallstones are noted within gallbladder. The pancreas, spleen and adrenal glands are unremarkable.  Kidneys are symmetrical in size and enhancement. No focal renal mass. No hydronephrosis or hydroureter. No nephrolithiasis. No calcified ureteral calculi.  Abdominal aorta is unremarkable. Some stool noted within cecum. No pericecal inflammation. Normal retrocecal appendix clearly visualize. The terminal ileum is unremarkable. No small bowel obstruction. No thickened or dilated small bowel loops. No ascites or free air. Few small mesenteric lymph nodes without adenopathy. There is a umbilical region hernia containing fat measures 3 x 2.3 cm without evidence of acute complication. The urinary bladder is under distended. Bilateral distal ureter is unremarkable. No colitis or diverticulitis. Small nonspecific bilateral inguinal lymph nodes.  IMPRESSION: 1. No acute inflammatory process within abdomen or pelvis. 2. Normal retrocecal appendix.  No pericecal inflammation. 3. No small bowel obstruction.  No colitis or diverticulitis. 4. There is mild hepatic fatty infiltration. 5. Small umbilical hernia containing fat without evidence of acute complication.   Electronically Signed   By: Natasha MeadLiviu  Pop M.D.   On: 06/23/2014 08:45     EKG Interpretation None      MDM   Final diagnoses:  Generalized abdominal pain    No acute abdominal process noted. Discussed return precautions with pt    Teressa LowerVrinda Kaimen Peine, NP 06/23/14 1047  Olivia Mackielga M Otter, MD 06/23/14 1259

## 2014-06-23 NOTE — ED Notes (Signed)
Pt arrives with c/o generalized abd pain for the last 4-5 days. No N/V/D. No hx of abd surgeries. Worse with certain movements.

## 2014-10-21 NOTE — Discharge Summary (Signed)
PATIENT NAME:  Wyatt LowersYNER, Wyatt Mckinney MR#:  [id number] DATE OF BIRTH:  [birth year]  DATE OF ADMISSION:  05/10/2013  DATE OF DISCHARGE:  05/11/2013  PRIMARY CARE PHYSICIAN: Nonlocal.  DISCHARGE DIAGNOSES: 1. Possible transient ischemic attack.  2. Obesity.   CONDITION: Stable.   CODE STATUS: Full code.   HOME MEDICATION: Aspirin 81 mg p.o. daily.   ACTIVITY: As tolerated.   FOLLOWUP CARE: Follow up with PCP within 1 to 2 weeks.   DIET: Low fat, low cholesterol diet.   REASON FOR ADMISSION: Left arm weakness and tingling.   HOSPITAL COURSE: The patient is a 39 year old African-American male with no past medical history, who presented to ED with above chief complaint, left arm weakness and tingling. By the time the patient presented to the ED, his symptoms resolved to his baseline. The patient was treated with aspirin 325 mg p.o. daily. A CAT scan of head did not show any acute abnormality. The patient was placed for observation for CVA workup. After admission, the patient's laboratory data were in normal range. CAT scan of head did not show acute findings. The patient got an echocardiograph and a carotid duplex, which are in normal range. The patient could not get an MRI due to body size; however, the patient has no symptoms. The patient's vital signs are stable. He is clinically stable and will be discharged to home today. I discussed the patient's discharge plan with the patient and the patient's family member. I advised the patient  diet control and exercise and follow up with primary care physician to check lipid panel and hemoglobin A1c.   TIME SPENT: About 32 minutes.   ____________________________ Shaune PollackQing Myleah Cavendish, MD qc:lb D: 05/11/2013 13:19:48 ET T: 05/11/2013 14:15:17 ET JOB#: [id number]  cc: Shaune PollackQing Jettson Crable, MD, <Dictator> Shaune PollackQING Lianni Kanaan MD ELECTRONICALLY SIGNED 05/13/2013 16:03

## 2014-10-21 NOTE — H&P (Signed)
PATIENT NAME:  Wyatt Mckinney, ARTS MR#:  [id number] DATE OF BIRTH:  1975-12-26  DATE OF ADMISSION:  05/11/2013  REFERRING PHYSICIAN:  Dr. Governor Rooks  PRIMARY CARE PHYSICIAN: Nonlocal physician.   CHIEF COMPLAINT: Left arm weakness and tingling.   HISTORY OF PRESENT ILLNESS: This is a 39 year old male without significant past medical history, who presents with the above-mentioned complaints. The patient reports he woke up from sleep three days ago with left arm tingling and numbness, but denies any weakness. He did not pay much attention to it. He reports this evening he was playing with his daughter, where he was trying to lift her jacket, where he felt weakness. He could not lift that, which got him concerned, came to ED. By the time he presented to the ED, his symptoms were resolved and he is back to his baseline, with 5 out of 5 motor strength in all extremities. The patient denies any neck pain or any shooting neck pain. Denies any slurred speech and confusion, any altered mental status, any fever or chills. The patient was given 324 mg of aspirin in the ED out of concern of transient ischemic attack. As well, the patient's CT head did not show any acute abnormalities. Hospitalist service was requested to admit the patient for further evaluation.   PAST MEDICAL HISTORY: None.   PAST SURGICAL HISTORY: None.   SOCIAL HISTORY: Works as a Nature conservation officer. Denies any smoking, alcohol or illicit drug use.   FAMILY HISTORY: Denies any history of CVA, hypertension, coronary artery disease in the family.   ALLERGIES: NO KNOWN DRUG ALLERGIES.   MEDICATION: None.   REVIEW OF SYSTEMS: CONSTITUTIONAL: Denies fever, chills, fatigue, weight gain, weight loss.  EYES: Denies blurry vision, double vision, inflammation, glaucoma.  EARS, NOSE, AND THROAT:  Denies ear pain, hearing loss, or discharge.  RESPIRATORY: Denies cough, wheezing, hemoptysis.  CARDIOVASCULAR: Denies chest pain, orthopnea, edema,  or palpitations.  GASTROINTESTINAL: Denies nausea, vomiting, diarrhea, abdominal pain, hematemesis.  GENITOURINARY: Denies dysuria, hematuria, renal colic.  ENDOCRINE: Denies polyuria, visits, heat or cold intolerance.  HEMATOLOGY: Denies anemia, easy bruising, bleeding diathesis.  INTEGUMENT: Denies acne, rash or skin lesions.  MUSCULOSKELETAL: Denies any cramps, arthritis, gout, back pain, neck pain, shoulder pain.  NEUROLOGIC:  Reports an episode of left upper extremity weakness that lasted for 30 minutes and left upper extremity tingling and numbness lasted for 48 hours. Both of them currently resolved. Denies any history of CVA, transient ischemic attack, seizures, headache, vertigo.  PSYCHIATRIC:  Denies anxiety, insomnia, bipolar disorder or schizophrenia.   PHYSICAL EXAMINATION: VITAL SIGNS: Temperature 97.8, pulse 56, respiratory rate 18, blood pressure 122/65, saturating 97% on room.  GENERAL: Obese male who looks comfortable in bed, in no apparent distress.  HEENT: Head atraumatic, normocephalic. Pupils equal, reactive to light. Pink conjunctivae. Anicteric sclerae. Moist oral mucosa.  NECK: Supple. No thyromegaly. No JVD.  CHEST: Good air entry bilaterally. No wheezing, rales, rhonchi.  CARDIOVASCULAR: S1, S2 heard. No rubs, murmur or gallops.  ABDOMEN: Obese, soft, nontender, nondistended. Bowel sounds present.  EXTREMITIES: No edema. No clubbing. No cyanosis. Pedal pulses felt bilaterally.  PSYCHIATRIC: Appropriate affect. Awake, alert x3. Intact judgment and insight.  MUSCULOSKELETAL: There is no neck tenderness to palpation.  NEUROLOGIC: Cranial nerves grossly intact. Motor 5 out of 5. No focal deficits. Sensation symmetrical and intact bilaterally to light touch. Motor 5 out of 5 in all extremities. Reflexes +2 and symmetrical bilaterally Negative extrapyramidal sign.  LYMPHATICS: No cervical or supraclavicular lymphadenopathy.  PERTINENT LABORATORY DATA: Glucose 103, BUN  14, creatinine 1.02, sodium 140, potassium 3.8, chloride 107, CO2 29, ALT 31, AST 21, alkaline phosphatase 73. Troponin less than 0.02. White blood cells 7.9, hemoglobin 14.3, hematocrit 43.7, platelets 183.   IMAGING STUDIES: CT head without contrast showing no acute findings.   ASSESSMENT AND PLAN: 1.  Left upper extremity tingling, numbness and weakness. All of his symptoms currently appears to be resolved. It is unclear if these were due to cervical radiculopathy versus transient ischemic attack episode. The patient already received 324 of aspirin in the Emergency Department. Will admit the patient to telemetry unit. We will continue with neurological workup to rule out a transient ischemic attack, we will check MRI of brain, we will check 2-D echo and carotid Dopplers, will have him on telemetry monitor. We will check lipid panel. As well, we will check MRI of the cervical spine to rule out any radiculopathy causing his symptoms. As well, will have him on neuro checks.  2.  Deep vein thrombosis prophylaxis. Subcutaneous heparin.   CODE STATUS: Full code.   TOTAL TIME SPENT ON ADMISSION AND PATIENT CARE: 45 minutes    ____________________________ Starleen Armsawood S. Noel Rodier, MD dse:cg D: 05/11/2013 00:24:02 ET T: 05/11/2013 01:01:07 ET JOB#: [id number]  cc: Starleen Armsawood S. Ashawnti Tangen, MD, <Dictator> Otisha Spickler Teena IraniS Amulya Quintin MD ELECTRONICALLY SIGNED 06/06/2013 2:30

## 2014-10-22 ENCOUNTER — Emergency Department
Admit: 2014-10-22 | Disposition: A | Discharge status: Home / Self Care | Payer: Self-pay | Admitting: Emergency Medicine

## 2014-10-22 LAB — URINALYSIS, COMPLETE
BACTERIA: NONE SEEN
Bilirubin,UR: NEGATIVE
Blood: NEGATIVE
GLUCOSE, UR: NEGATIVE mg/dL (ref 0–75)
Ketone: NEGATIVE
Leukocyte Esterase: NEGATIVE
NITRITE: NEGATIVE
PROTEIN: NEGATIVE
Ph: 5 (ref 4.5–8.0)
Specific Gravity: 1.018 (ref 1.003–1.030)
Squamous Epithelial: NONE SEEN

## 2014-10-22 LAB — CBC WITH DIFFERENTIAL/PLATELET
Basophil #: 0.1 10*3/uL (ref 0.0–0.1)
Basophil %: 0.9 %
Eosinophil #: 0.2 10*3/uL (ref 0.0–0.7)
Eosinophil %: 3.7 %
HCT: 45.3 % (ref 40.0–52.0)
HGB: 14.7 g/dL (ref 13.0–18.0)
LYMPHS PCT: 29.5 %
Lymphocyte #: 2 10*3/uL (ref 1.0–3.6)
MCH: 28.4 pg (ref 26.0–34.0)
MCHC: 32.3 g/dL (ref 32.0–36.0)
MCV: 88 fL (ref 80–100)
MONOS PCT: 7 %
Monocyte #: 0.5 x10 3/mm (ref 0.2–1.0)
Neutrophil #: 4 10*3/uL (ref 1.4–6.5)
Neutrophil %: 58.9 %
PLATELETS: 189 10*3/uL (ref 150–440)
RBC: 5.17 10*6/uL (ref 4.40–5.90)
RDW: 14.7 % — ABNORMAL HIGH (ref 11.5–14.5)
WBC: 6.7 10*3/uL (ref 3.8–10.6)

## 2014-10-22 LAB — BASIC METABOLIC PANEL
Anion Gap: 7 (ref 7–16)
BUN: 12 mg/dL
CHLORIDE: 106 mmol/L
Calcium, Total: 8.5 mg/dL — ABNORMAL LOW
Co2: 26 mmol/L
Creatinine: 0.92 mg/dL
EGFR (African American): >60
EGFR (Non-African Amer.): >60
GLUCOSE: 122 mg/dL — AB
POTASSIUM: 4.2 mmol/L
Sodium: 139 mmol/L

## 2014-10-22 LAB — TROPONIN I

## 2015-04-17 ENCOUNTER — Emergency Department: Payer: No Typology Code available for payment source

## 2015-04-17 ENCOUNTER — Encounter: Payer: Self-pay | Admitting: Emergency Medicine

## 2015-04-17 DIAGNOSIS — R079 Chest pain, unspecified: Secondary | ICD-10-CM | POA: Insufficient documentation

## 2015-04-17 LAB — CBC
HCT: 42.7 % (ref 40.0–52.0)
Hemoglobin: 13.7 g/dL (ref 13.0–18.0)
MCH: 27.6 pg (ref 26.0–34.0)
MCHC: 32.1 g/dL (ref 32.0–36.0)
MCV: 86 fL (ref 80.0–100.0)
PLATELETS: 159 10*3/uL (ref 150–440)
RBC: 4.96 MIL/uL (ref 4.40–5.90)
RDW: 14.7 % — ABNORMAL HIGH (ref 11.5–14.5)
WBC: 6.8 10*3/uL (ref 3.8–10.6)

## 2015-04-17 LAB — BASIC METABOLIC PANEL
Anion gap: 5 (ref 5–15)
BUN: 15 mg/dL (ref 6–20)
CALCIUM: 8.5 mg/dL — AB (ref 8.9–10.3)
CHLORIDE: 108 mmol/L (ref 101–111)
CO2: 26 mmol/L (ref 22–32)
CREATININE: 0.96 mg/dL (ref 0.61–1.24)
Glucose, Bld: 114 mg/dL — ABNORMAL HIGH (ref 65–99)
Potassium: 3.7 mmol/L (ref 3.5–5.1)
SODIUM: 139 mmol/L (ref 135–145)

## 2015-04-17 LAB — TROPONIN I

## 2015-04-17 NOTE — ED Notes (Signed)
Pt presents to ED with mid sternal chest pressure that started around 1500. Pt denies similar symptoms previously. Denies shortness of breath or nausea. Pt currently has no increased work of breathing or acute distress noted at this time. Pain not reproducible with palpation.

## 2015-04-18 ENCOUNTER — Emergency Department
Admission: EM | Admit: 2015-04-18 | Discharge: 2015-04-18 | Disposition: A | Discharge status: Home / Self Care | Payer: Self-pay | Attending: Emergency Medicine | Admitting: Emergency Medicine

## 2015-04-18 DIAGNOSIS — R079 Chest pain, unspecified: Secondary | ICD-10-CM

## 2015-04-18 LAB — TROPONIN I

## 2015-04-18 NOTE — ED Notes (Signed)

## 2015-04-18 NOTE — Discharge Instructions (Signed)
Nonspecific Chest Pain  °Chest pain can be caused by many different conditions. There is always a chance that your pain could be related to something serious, such as a heart attack or a blood clot in your lungs. Chest pain can also be caused by conditions that are not life-threatening. If you have chest pain, it is very important to follow up with your health care provider. °CAUSES  °Chest pain can be caused by: °· Heartburn. °· Pneumonia or bronchitis. °· Anxiety or stress. °· Inflammation around your heart (pericarditis) or lung (pleuritis or pleurisy). °· A blood clot in your lung. °· A collapsed lung (pneumothorax). It can develop suddenly on its own (spontaneous pneumothorax) or from trauma to the chest. °· Shingles infection (varicella-zoster virus). °· Heart attack. °· Damage to the bones, muscles, and cartilage that make up your chest wall. This can include: °¨ Bruised bones due to injury. °¨ Strained muscles or cartilage due to frequent or repeated coughing or overwork. °¨ Fracture to one or more ribs. °¨ Sore cartilage due to inflammation (costochondritis). °RISK FACTORS  °Risk factors for chest pain may include: °· Activities that increase your risk for trauma or injury to your chest. °· Respiratory infections or conditions that cause frequent coughing. °· Medical conditions or overeating that can cause heartburn. °· Heart disease or family history of heart disease. °· Conditions or health behaviors that increase your risk of developing a blood clot. °· Having had chicken pox (varicella zoster). °SIGNS AND SYMPTOMS °Chest pain can feel like: °· Burning or tingling on the surface of your chest or deep in your chest. °· Crushing, pressure, aching, or squeezing pain. °· Dull or sharp pain that is worse when you move, cough, or take a deep breath. °· Pain that is also felt in your back, neck, shoulder, or arm, or pain that spreads to any of these areas. °Your chest pain may come and go, or it may stay  constant. °DIAGNOSIS °Lab tests or other studies may be needed to find the cause of your pain. Your health care provider may have you take a test called an ambulatory ECG (electrocardiogram). An ECG records your heartbeat patterns at the time the test is performed. You may also have other tests, such as: °· Transthoracic echocardiogram (TTE). During echocardiography, sound waves are used to create a picture of all of the heart structures and to look at how blood flows through your heart. °· Transesophageal echocardiogram (TEE). This is a more advanced imaging test that obtains images from inside your body. It allows your health care provider to see your heart in finer detail. °· Cardiac monitoring. This allows your health care provider to monitor your heart rate and rhythm in real time. °· Holter monitor. This is a portable device that records your heartbeat and can help to diagnose abnormal heartbeats. It allows your health care provider to track your heart activity for several days, if needed. °· Stress tests. These can be done through exercise or by taking medicine that makes your heart beat more quickly. °· Blood tests. °· Imaging tests. °TREATMENT  °Your treatment depends on what is causing your chest pain. Treatment may include: °· Medicines. These may include: °¨ Acid blockers for heartburn. °¨ Anti-inflammatory medicine. °¨ Pain medicine for inflammatory conditions. °¨ Antibiotic medicine, if an infection is present. °¨ Medicines to dissolve blood clots. °¨ Medicines to treat coronary artery disease. °· Supportive care for conditions that do not require medicines. This may include: °¨ Resting. °¨ Applying heat   or cold packs to injured areas. °¨ Limiting activities until pain decreases. °HOME CARE INSTRUCTIONS °· If you were prescribed an antibiotic medicine, finish it all even if you start to feel better. °· Avoid any activities that bring on chest pain. °· Do not use any tobacco products, including  cigarettes, chewing tobacco, or electronic cigarettes. If you need help quitting, ask your health care provider. °· Do not drink alcohol. °· Take medicines only as directed by your health care provider. °· Keep all follow-up visits as directed by your health care provider. This is important. This includes any further testing if your chest pain does not go away. °· If heartburn is the cause for your chest pain, you may be told to keep your head raised (elevated) while sleeping. This reduces the chance that acid will go from your stomach into your esophagus. °· Make lifestyle changes as directed by your health care provider. These may include: °¨ Getting regular exercise. Ask your health care provider to suggest some activities that are safe for you. °¨ Eating a heart-healthy diet. A registered dietitian can help you to learn healthy eating options. °¨ Maintaining a healthy weight. °¨ Managing diabetes, if necessary. °¨ Reducing stress. °SEEK MEDICAL CARE IF: °· Your chest pain does not go away after treatment. °· You have a rash with blisters on your chest. °· You have a fever. °SEEK IMMEDIATE MEDICAL CARE IF:  °· Your chest pain is worse. °· You have an increasing cough, or you cough up blood. °· You have severe abdominal pain. °· You have severe weakness. °· You faint. °· You have chills. °· You have sudden, unexplained chest discomfort. °· You have sudden, unexplained discomfort in your arms, back, neck, or jaw. °· You have shortness of breath at any time. °· You suddenly start to sweat, or your skin gets clammy. °· You feel nauseous or you vomit. °· You suddenly feel light-headed or dizzy. °· Your heart begins to beat quickly, or it feels like it is skipping beats. °These symptoms may represent a serious problem that is an emergency. Do not wait to see if the symptoms will go away. Get medical help right away. Call your local emergency services (911 in the [place] Do not drive yourself to the hospital. °  °This  information is not intended to replace advice given to you by your health care provider. Make sure you discuss any questions you have with your health care provider. °  °Document Released: 03/27/2005 Document Revised: 07/08/2014 Document Reviewed: 01/21/2014 °Elsevier Interactive Patient Education ©2016 [employer] ° °

## 2015-04-18 NOTE — ED Provider Notes (Signed)
[facility] Emergency Department Provider Note  ____________________________________________  Time seen: Approximately 1:08 AM  I have reviewed the triage vital signs and the nursing notes.   HISTORY  Chief Complaint Chest Pain    HPI Wyatt Mckinney is a 39 y.o. male who comes into the hospital today with chest pain. The patient reports that he started having some chest pain around 3:57 PM. He reports that he was not doing anything when the pain started. Said that it was pressure in the center of his chest but it did not last very long. He reports that it lasted maybe 2 minutes tops. He reports that when he lays down he doesn't really have much pain but it comes back when he jumps up bending goes away when he moves around or moves his arms. The patient reports that he is currently not having any pain in his pain is a 0 out of 10 in intensity. The patient did not feel any shortness of breath did not have any radiation of the pain did not feel dizzy or lightheaded. He also reports that he's had no sweats nausea or vomiting. The patient was unsure what caused the pain so he decided to come in for evaluation. He did not take anything for the pain.   History reviewed. No pertinent past medical history.  There are no active problems to display for this patient.   History reviewed. No pertinent past surgical history.  Current Outpatient Rx  Name  Route  Sig  Dispense  Refill  . ibuprofen (ADVIL,MOTRIN) 800 MG tablet   Oral   Take 800 mg by mouth every 8 (eight) hours as needed for moderate pain.         Marland Kitchen HYDROcodone-acetaminophen (NORCO/VICODIN) 5-325 MG per tablet   Oral   Take 2 tablets by mouth every 4 (four) hours as needed. Patient not taking: Reported on 04/18/2015   10 tablet   0   . naproxen (NAPROSYN) 500 MG tablet   Oral   Take 1 tablet (500 mg total) by mouth 2 (two) times daily. Patient not taking: Reported on 04/18/2015   30 tablet   0    . naproxen (NAPROSYN) 500 MG tablet   Oral   Take 1 tablet (500 mg total) by mouth 2 (two) times daily with a meal. Patient not taking: Reported on 04/18/2015   30 tablet   0   . traMADol (ULTRAM) 50 MG tablet   Oral   Take 1 tablet (50 mg total) by mouth every 6 (six) hours as needed. Patient not taking: Reported on 04/18/2015   15 tablet   0   . traMADol (ULTRAM) 50 MG tablet   Oral   Take 1 tablet (50 mg total) by mouth every 6 (six) hours as needed. Patient not taking: Reported on 04/18/2015   15 tablet   0     Allergies Review of patient's allergies indicates no known allergies.  No family history on file.  Social History Social History  Substance Use Topics  . Smoking status: Never Smoker   . Smokeless tobacco: None  . Alcohol Use: No    Review of Systems Constitutional: No fever/chills Eyes: No visual changes. ENT: No sore throat. Cardiovascular:  chest pain. Respiratory: Denies shortness of breath. Gastrointestinal: No abdominal pain.  No nausea, no vomiting.  No diarrhea.  No constipation. Genitourinary: Negative for dysuria. Musculoskeletal: Negative for back pain. Skin: Negative for rash. Neurological: Negative for headaches, focal weakness or  numbness.  10-point ROS otherwise negative.  ____________________________________________   PHYSICAL EXAM:  VITAL SIGNS: ED Triage Vitals  Enc Vitals Group     BP 04/17/15 2242 128/72 mmHg     Pulse Rate 04/17/15 2242 63     Resp 04/17/15 2242 18     Temp 04/17/15 2242 97.9 F (36.6 C)     Temp Source 04/17/15 2242 Oral     SpO2 04/17/15 2242 96 %     Weight 04/17/15 2242 393 lb (178.264 kg)     Height 04/17/15 2242 5\' 9"  (1.753 m)     Head Cir --      Peak Flow --      Pain Score 04/17/15 2242 0     Pain Loc --      Pain Edu? --      Excl. in GC? --     Constitutional: Alert and oriented. Well appearing and in no acute distress. Eyes: Conjunctivae are normal. PERRL. EOMI. Head:  Atraumatic. Nose: No congestion/rhinnorhea. Mouth/Throat: Mucous membranes are moist.  Oropharynx non-erythematous. Cardiovascular: Normal rate, regular rhythm. Grossly normal heart sounds.  Good peripheral circulation. Respiratory: Normal respiratory effort.  No retractions. Lungs CTAB. Gastrointestinal: Soft and nontender. No distention. Positive bowel sounds Musculoskeletal: No lower extremity tenderness nor edema.   Neurologic:  Normal speech and language. No gross focal neurologic deficits are appreciated.  Skin:  Skin is warm, dry and intact.  Psychiatric: Mood and affect are normal.   ____________________________________________   LABS (all labs ordered are listed, but only abnormal results are displayed)  Labs Reviewed  BASIC METABOLIC PANEL - Abnormal; Notable for the following:    Glucose, Bld 114 (*)    Calcium 8.5 (*)    All other components within normal limits  CBC - Abnormal; Notable for the following:    RDW 14.7 (*)    All other components within normal limits  TROPONIN I  TROPONIN I   ____________________________________________  EKG  ED ECG REPORT I, Rebecka ApleyWebster,  Anella Nakata P, the attending physician, personally viewed and interpreted this ECG.   Date: 04/18/2015  EKG Time: 2240  Rate: 56  Rhythm: sinus bradycardia  Axis: Normal  Intervals:none  ST&T Change: None  ____________________________________________  RADIOLOGY  Chest x-ray: No active disease ____________________________________________   PROCEDURES  Procedure(s) performed: None  Critical Care performed: No  ____________________________________________   INITIAL IMPRESSION / ASSESSMENT AND PLAN / ED COURSE  Pertinent labs & imaging results that were available during my care of the patient were reviewed by me and considered in my medical decision making (see chart for details).  This is a 39 year old male who comes into the hospital today with some chest pain. The patient reports  this pain is resolved but he wanted to be evaluated. The patient's initial set of blood work is unremarkable. The patient's chest x-ray is also unremarkable. I will repeat the patient's troponin and if it is negative have him follow-up for stress test with a primary care physician.  I did a repeat troponin at 3 hours and the patient has no further pain. The repeat troponin was negative. I will discharge the patient to home and have him follow-up for a stress test and further evaluation of his chest pain. ____________________________________________   FINAL CLINICAL IMPRESSION(S) / ED DIAGNOSES  Final diagnoses:  Chest pain, unspecified chest pain type      Rebecka ApleyAllison P Renaud Celli, MD 04/18/15 [phone]

## 2015-04-18 NOTE — ED Notes (Signed)
MD at bedside. 

## 2015-05-09 ENCOUNTER — Emergency Department: Payer: Self-pay

## 2015-05-09 ENCOUNTER — Emergency Department
Admission: EM | Admit: 2015-05-09 | Discharge: 2015-05-09 | Disposition: A | Discharge status: Home / Self Care | Payer: Self-pay | Attending: Emergency Medicine | Admitting: Emergency Medicine

## 2015-05-09 ENCOUNTER — Encounter: Payer: Self-pay | Admitting: Emergency Medicine

## 2015-05-09 DIAGNOSIS — R200 Anesthesia of skin: Secondary | ICD-10-CM

## 2015-05-09 LAB — DIFFERENTIAL
BASOS ABS: 0.1 10*3/uL (ref 0–0.1)
BASOS PCT: 2 %
Eosinophils Absolute: 0.4 10*3/uL (ref 0–0.7)
Eosinophils Relative: 5 %
LYMPHS PCT: 26 %
Lymphs Abs: 1.7 10*3/uL (ref 1.0–3.6)
MONO ABS: 0.5 10*3/uL (ref 0.2–1.0)
MONOS PCT: 7 %
NEUTROS ABS: 4.1 10*3/uL (ref 1.4–6.5)
Neutrophils Relative %: 60 %

## 2015-05-09 LAB — COMPREHENSIVE METABOLIC PANEL
ALK PHOS: 54 U/L (ref 38–126)
ALT: 22 U/L (ref 17–63)
AST: 19 U/L (ref 15–41)
Albumin: 3.8 g/dL (ref 3.5–5.0)
Anion gap: 3 — ABNORMAL LOW (ref 5–15)
BUN: 16 mg/dL (ref 6–20)
CALCIUM: 8.7 mg/dL — AB (ref 8.9–10.3)
CHLORIDE: 108 mmol/L (ref 101–111)
CO2: 27 mmol/L (ref 22–32)
CREATININE: 0.96 mg/dL (ref 0.61–1.24)
Glucose, Bld: 109 mg/dL — ABNORMAL HIGH (ref 65–99)
Potassium: 4.1 mmol/L (ref 3.5–5.1)
SODIUM: 138 mmol/L (ref 135–145)
Total Bilirubin: 0.3 mg/dL (ref 0.3–1.2)
Total Protein: 7.7 g/dL (ref 6.5–8.1)

## 2015-05-09 LAB — CBC
HEMATOCRIT: 45.6 % (ref 40.0–52.0)
Hemoglobin: 14.6 g/dL (ref 13.0–18.0)
MCH: 27.4 pg (ref 26.0–34.0)
MCHC: 32 g/dL (ref 32.0–36.0)
MCV: 85.8 fL (ref 80.0–100.0)
Platelets: 206 10*3/uL (ref 150–440)
RBC: 5.31 MIL/uL (ref 4.40–5.90)
RDW: 14.8 % — AB (ref 11.5–14.5)
WBC: 6.7 10*3/uL (ref 3.8–10.6)

## 2015-05-09 LAB — PROTIME-INR
INR: 0.99
Prothrombin Time: 13.3 seconds (ref 11.4–15.0)

## 2015-05-09 LAB — TROPONIN I

## 2015-05-09 LAB — APTT: APTT: 25 s (ref 24–36)

## 2015-05-09 LAB — MAGNESIUM: Magnesium: 2.1 mg/dL (ref 1.7–2.4)

## 2015-05-09 NOTE — ED Notes (Addendum)
Pt states yesterday he noticed his "mouth twitching", and then today about 45 min ago at 12:45 he noticed numbness to entire face with "soreness" when he touches face, denies any other symptoms, face symmetrical

## 2015-05-09 NOTE — ED Provider Notes (Signed)
[facility] Emergency Department Provider Note  ____________________________________________   I have reviewed the triage vital signs and the nursing notes.   HISTORY  Chief Complaint facial numbness     HPI Wyatt Mckinney is a 39 y.o. male with a history of morbid obesity otherwise baseline healthy presents today complaining of facial numbness. He states he was driving down the road and his entire face became numb. He states he did have a twitch for a few minutes but that's gone. He denies any numbness or weakness in any other part of his body, he states his entire face was numb but now is back to baseline. He does not have a history of panic attacks he states.  History reviewed. No pertinent past medical history.  There are no active problems to display for this patient.   History reviewed. No pertinent past surgical history.  Current Outpatient Rx  Name  Route  Sig  Dispense  Refill  . HYDROcodone-acetaminophen (NORCO/VICODIN) 5-325 MG per tablet   Oral   Take 2 tablets by mouth every 4 (four) hours as needed. Patient not taking: Reported on 04/18/2015   10 tablet   0   . ibuprofen (ADVIL,MOTRIN) 800 MG tablet   Oral   Take 800 mg by mouth every 8 (eight) hours as needed for moderate pain.         . naproxen (NAPROSYN) 500 MG tablet   Oral   Take 1 tablet (500 mg total) by mouth 2 (two) times daily. Patient not taking: Reported on 04/18/2015   30 tablet   0   . naproxen (NAPROSYN) 500 MG tablet   Oral   Take 1 tablet (500 mg total) by mouth 2 (two) times daily with a meal. Patient not taking: Reported on 04/18/2015   30 tablet   0   . traMADol (ULTRAM) 50 MG tablet   Oral   Take 1 tablet (50 mg total) by mouth every 6 (six) hours as needed. Patient not taking: Reported on 04/18/2015   15 tablet   0   . traMADol (ULTRAM) 50 MG tablet   Oral   Take 1 tablet (50 mg total) by mouth every 6 (six) hours as needed. Patient not  taking: Reported on 04/18/2015   15 tablet   0     Allergies Review of patient's allergies indicates no known allergies.  No family history on file.  Social History Social History  Substance Use Topics  . Smoking status: Never Smoker   . Smokeless tobacco: None  . Alcohol Use: No    Review of Systems Constitutional: No fever/chills Eyes: No visual changes. ENT: No sore throat. No stiff neck no neck pain Cardiovascular: Denies chest pain. Respiratory: Denies shortness of breath. Gastrointestinal:   no vomiting.  No diarrhea.  No constipation. Genitourinary: Negative for dysuria. Musculoskeletal: Negative lower extremity swelling Skin: Negative for rash. Neurological: Negative for headaches, focal weakness , his entire face was briefly numb apparently 10-point ROS otherwise negative.  ____________________________________________   PHYSICAL EXAM:  VITAL SIGNS: ED Triage Vitals  Enc Vitals Group     BP 05/09/15 1330 154/78 mmHg     Pulse Rate 05/09/15 1330 83     Resp 05/09/15 1330 18     Temp 05/09/15 1330 98.6 F (37 C)     Temp Source 05/09/15 1330 Oral     SpO2 05/09/15 1330 94 %     Weight 05/09/15 1330 404 lb (183.253 kg)  Height 05/09/15 1330  (1.753 m)     Head Cir --      Peak Flow --      Pain Score 05/09/15 1331 0     Pain Loc --      Pain Edu? --      Excl. in GC? --     Constitutional: Alert and oriented. Well appearing and in no acute distress. Eyes: Conjunctivae are normal. PERRL. EOMI. Head: Atraumatic. Nose: No congestion/rhinnorhea. Mouth/Throat: Mucous membranes are moist.  Oropharynx non-erythematous. Neck: No stridor.   Nontender with no meningismus Cardiovascular: Normal rate, regular rhythm. Grossly normal heart sounds.  Good peripheral circulation. Respiratory: Normal respiratory effort.  No retractions. Lungs CTAB. Abdominal: Soft and nontender. No distention. No guarding no rebound or obesity noted Back:  There is no  focal tenderness or step off there is no midline tenderness there are no lesions noted. there is no CVA tenderness Musculoskeletal: No lower extremity tenderness. No joint effusions, no DVT signs strong distal pulses no edema Cranial nerves II through XII are grossly intact 5 out of 5 strength bilateral upper and lower extremity. Finger to nose within normal limits heel to shin within normal limits, speech is normal with no word finding difficulty ordered dysarthria, reflexes symmetric pupils are equally round and reactive to light there is no pronator drift, sensation is normal, normal neurologic exam NIH stroke scale 0  Skin:  Skin is warm, dry and intact. No rash noted. Psychiatric: Mood and affect are normal. Speech and behavior are normal.  ____________________________________________   LABS (all labs ordered are listed, but only abnormal results are displayed)  Labs Reviewed  CBC - Abnormal; Notable for the following:    RDW 14.8 (*)    All other components within normal limits  DIFFERENTIAL  PROTIME-INR  APTT  COMPREHENSIVE METABOLIC PANEL  TROPONIN I  MAGNESIUM  CBG MONITORING, ED   ____________________________________________  EKG  I personally interpreted any EKGs ordered by me or triage Sinus rhythm rate 83 bpm no acute ST elevation or acute ST depression normal axis unremarkable EKG ____________________________________________  RADIOLOGY  I reviewed any imaging ordered by me or triage that were performed during my shift ____________________________________________   PROCEDURES  Procedure(s) performed: None  Critical Care performed: None  ____________________________________________   INITIAL IMPRESSION / ASSESSMENT AND PLAN / ED COURSE  Pertinent labs & imaging results that were available during my care of the patient were reviewed by me and considered in my medical decision making (see chart for details).  Patient here with a non-anatomically  distributed sensation of sensory change in his entire face with no evidence of stroke. CT is negative blood work reassuring, do not believe this was a TIA given its distribution he has no complaints or symptoms here, serial neurologic exams are negative, NIH stroke scale is 0. Obviously not a candidate for TPA blood work is reassuring exam is reassuring vital signs are reassuring we'll discharge ____________________________________________   FINAL CLINICAL IMPRESSION(S) / ED DIAGNOSES  Final diagnoses:  None     Jeanmarie Plant, MD 05/09/15 1517

## 2015-05-09 NOTE — Discharge Instructions (Signed)
Paresthesia Paresthesia is an abnormal burning or prickling sensation. This sensation is generally felt in the hands, arms, legs, or feet. However, it may occur in any part of the body. Usually, it is not painful. The feeling may be described as:  Tingling or numbness.  Pins and needles.  Skin crawling.  Buzzing.  Limbs falling asleep.  Itching. Most people experience temporary (transient) paresthesia at some time in their lives. Paresthesia may occur when you breathe too quickly (hyperventilation). It can also occur without any apparent cause. Commonly, paresthesia occurs when pressure is placed on a nerve. The sensation quickly goes away after the pressure is removed. For some people, however, paresthesia is a long-lasting (chronic) condition that is caused by an underlying disorder. If you continue to have paresthesia, you may need further medical evaluation. HOME CARE INSTRUCTIONS Watch your condition for any changes. Taking the following actions may help to lessen any discomfort that you are feeling:  Avoid drinking alcohol.  Try acupuncture or massage to help relieve your symptoms.  Keep all follow-up visits as directed by your health care provider. This is important. SEEK MEDICAL CARE IF:  You continue to have episodes of paresthesia.  Your burning or prickling feeling gets worse when you walk.  You have pain, cramps, or dizziness.  You develop a rash. SEEK IMMEDIATE MEDICAL CARE IF:  You feel weak.  You have trouble walking or moving.  You have problems with speech, understanding, or vision.  You feel confused.  You cannot control your bladder or bowel movements.  You have numbness after an injury.  You faint.   This information is not intended to replace advice given to you by your health care provider. Make sure you discuss any questions you have with your health care provider.   Document Released: 06/07/2002 Document Revised: 11/01/2014 Document Reviewed:  06/13/2014 Elsevier Interactive Patient Education 2016 [employer]  

## 2016-02-01 ENCOUNTER — Encounter: Payer: Self-pay | Admitting: Emergency Medicine

## 2016-02-01 ENCOUNTER — Emergency Department
Admission: EM | Admit: 2016-02-01 | Discharge: 2016-02-01 | Disposition: A | Discharge status: Home / Self Care | Payer: Self-pay | Attending: Emergency Medicine | Admitting: Emergency Medicine

## 2016-02-01 ENCOUNTER — Emergency Department: Payer: Self-pay

## 2016-02-01 DIAGNOSIS — Z79899 Other long term (current) drug therapy: Secondary | ICD-10-CM | POA: Insufficient documentation

## 2016-02-01 DIAGNOSIS — R079 Chest pain, unspecified: Secondary | ICD-10-CM

## 2016-02-01 DIAGNOSIS — R0789 Other chest pain: Secondary | ICD-10-CM | POA: Insufficient documentation

## 2016-02-01 LAB — TROPONIN I

## 2016-02-01 LAB — BASIC METABOLIC PANEL
ANION GAP: 5 (ref 5–15)
BUN: 14 mg/dL (ref 6–20)
CALCIUM: 8.5 mg/dL — AB (ref 8.9–10.3)
CO2: 27 mmol/L (ref 22–32)
CREATININE: 0.98 mg/dL (ref 0.61–1.24)
Chloride: 107 mmol/L (ref 101–111)
GLUCOSE: 115 mg/dL — AB (ref 65–99)
Potassium: 3.9 mmol/L (ref 3.5–5.1)
Sodium: 139 mmol/L (ref 135–145)

## 2016-02-01 LAB — CBC
HCT: 43 % (ref 40.0–52.0)
HEMOGLOBIN: 14.5 g/dL (ref 13.0–18.0)
MCH: 29 pg (ref 26.0–34.0)
MCHC: 33.7 g/dL (ref 32.0–36.0)
MCV: 86 fL (ref 80.0–100.0)
PLATELETS: 181 10*3/uL (ref 150–440)
RBC: 5 MIL/uL (ref 4.40–5.90)
RDW: 14.3 % (ref 11.5–14.5)
WBC: 5.9 10*3/uL (ref 3.8–10.6)

## 2016-02-01 MED ORDER — ASPIRIN 81 MG PO CHEW: 324.0000 mg | CHEWABLE_TABLET | Freq: Once | ORAL | Status: DC

## 2016-02-01 NOTE — Discharge Instructions (Signed)
You were evaluated for chest pain episode, and although no certain cause was found for your episode, your exam and evaluation are reassuring in the emergency department today. As we discussed, I am recommending that you follow up with a cardiologist which might recommend stress testing. Call the office to make an appointment within the next 1-2 days.  Take a baby aspirin daily 81mg , until discussed with cardiologist about whether to continue.  Return to the emergency department for any new or worsening chest pain, nausea, sweats, passing out, numbness, weakness, confusion or altered mental status, trouble breathing, or any other symptoms concerning to you.

## 2016-02-01 NOTE — ED Provider Notes (Signed)
[facility] Emergency Department Provider Note ____________________________________________  Time seen: Approximately 9am I have reviewed the triage vital signs and the triage nursing note.  HISTORY  Chief Complaint Chest Pain   Historian Patient  HPI Wyatt Mckinney is a 40 y.o. male obese, but without a diagnosis of coronary artery disease, CHF, hypertension, diabetes, although he is not following with a primary care physician.  This morning he woke up and started having some left-sided chest pain described as somewhat sharp, but not pleuritic or related to breathing, and went ahead and hopped in the shower, after the shower and approximate duration of 15 minutes he was still having chest discomfort and developed lightheadedness/dizziness and when he went to wipe his face he felt like both sides were numb and so he called EMS. By the time EMS arrived there he was asymptomatic, no more chest pain, lightheadedness, or face numbness. He did not have slurred speech, aphasia, weakness or numbness of the extremities, syncope, palpitations, or shortness of breath.  Symptoms are moderate and now resolved/gone.      History reviewed. No pertinent past medical history.  There are no active problems to display for this patient.   History reviewed. No pertinent surgical history.  Prior to Admission medications   Medication Sig Start Date End Date Taking? Authorizing Provider  HYDROcodone-acetaminophen (NORCO/VICODIN) 5-325 MG per tablet Take 2 tablets by mouth every 4 (four) hours as needed. Patient not taking: Reported on 04/18/2015 06/23/14   Teressa Lower, NP  ibuprofen (ADVIL,MOTRIN) 800 MG tablet Take 800 mg by mouth every 8 (eight) hours as needed for moderate pain.    Historical Provider, MD  naproxen (NAPROSYN) 500 MG tablet Take 1 tablet (500 mg total) by mouth 2 (two) times daily. Patient not taking: Reported on 04/18/2015 05/15/13   Santiago Glad, PA-C   naproxen (NAPROSYN) 500 MG tablet Take 1 tablet (500 mg total) by mouth 2 (two) times daily with a meal. Patient not taking: Reported on 04/18/2015 06/19/14   Francee Piccolo, PA-C  traMADol (ULTRAM) 50 MG tablet Take 1 tablet (50 mg total) by mouth every 6 (six) hours as needed. Patient not taking: Reported on 04/18/2015 05/15/13   Santiago Glad, PA-C  traMADol (ULTRAM) 50 MG tablet Take 1 tablet (50 mg total) by mouth every 6 (six) hours as needed. Patient not taking: Reported on 04/18/2015 06/19/14   Francee Piccolo, PA-C    No Known Allergies  No family history on file.  Social History Social History  Substance Use Topics  . Smoking status: Never Smoker  . Smokeless tobacco: Never Used  . Alcohol use No    Review of Systems  Constitutional: Negative for Fevers or recent illnesses. Eyes: Negative for visual changes. ENT: Negative for sore throat. Cardiovascular: Negative for pleuritic chest pain or palpitations, but sharp chest pain and left-sided as per history of present illness. Respiratory: Negative for shortness of breath. Gastrointestinal: Negative for abdominal pain or diarrhea. Genitourinary: Negative for dysuria. Musculoskeletal: Negative for back pain. Skin: Negative for rash. Neurological: Negative for headache. 10 point Review of Systems otherwise negative ____________________________________________   PHYSICAL EXAM:  VITAL SIGNS: ED Triage Vitals [02/01/16 0843]  Enc Vitals Group     BP (!) 141/100     Pulse Rate 81     Resp (!) 22     Temp 98.3 F (36.8 C)     Temp Source Oral     SpO2 96 %     Weight (!) 383  lb (173.7 kg)     Height 5\' 8"  (1.727 m)     Head Circumference      Peak Flow      Pain Score      Pain Loc      Pain Edu?      Excl. in GC?      Constitutional: Alert and oriented. Well appearing and in no distress. HEENT   Head: Normocephalic and atraumatic.      Eyes: Conjunctivae are normal. PERRL. Normal  extraocular movements.      Ears:         Nose: No congestion/rhinnorhea.   Mouth/Throat: Mucous membranes are moist.   Neck: No stridor. Cardiovascular/Chest: Normal rate, regular rhythm.  No murmurs, rubs, or gallops. Respiratory: Normal respiratory effort without tachypnea nor retractions. Breath sounds are clear and equal bilaterally. No wheezes/rales/rhonchi. Gastrointestinal: Soft. No distention, no guarding, no rebound. Nontender. Morbidly obese Genitourinary/rectal:Deferred Musculoskeletal: Nontender with normal range of motion in all extremities. No joint effusions.  No lower extremity tenderness.  No edema. Neurologic:  Normal speech and language. No gross or focal neurologic deficits are appreciated. Skin:  Skin is warm, dry and intact. No rash noted. Psychiatric: Mood and affect are normal. Speech and behavior are normal. Patient exhibits appropriate insight and judgment.  ____________________________________________   EKG I, Governor Rooks, MD, the attending physician have personally viewed and interpreted all ECGs.  73 bpm. Normal sinus rhythm. Narrow QRS. Normal axis. Normal ST and T-wave ____________________________________________  LABS (pertinent positives/negatives)  Labs Reviewed  BASIC METABOLIC PANEL - Abnormal; Notable for the following:       Result Value   Glucose, Bld 115 (*)    Calcium 8.5 (*)    All other components within normal limits  CBC  TROPONIN I  TROPONIN I    ____________________________________________  RADIOLOGY All Xrays were viewed by me. Imaging interpreted by Radiologist.   Chest two-view: No edema or consolidation. __________________________________________  PROCEDURES  Procedure(s) performed: None  Critical Care performed: None  ____________________________________________   ED COURSE / ASSESSMENT AND PLAN  Pertinent labs & imaging results that were available during my care of the patient were reviewed by me and  considered in my medical decision making (see chart for details).   This patient had an episode of chest pain which was nonspecific, and lasted probably about 15 minutes was gone upon arrival. His EKG is reassuring. Symptoms do not sound like PE. He's not had URI or pneumonia symptoms.  He does have a history of indigestion, but this felt different to him. His initial troponin is reassuring and negative as it is a 3 hour repeat.  I have a low suspicious of acute urinary syndrome, although I don't have a certain diagnosis for his episode today. I have recommended follow-up outpatient with cardiologist and spoke with Dr. Welton Flakes who will see this patient in close follow-up.    CONSULTATIONS:  Dr. Welton Flakes, recommends outpatient follow up, will see tomorrow in office at 9am.   Patient / Family / Caregiver informed of clinical course, medical decision-making process, and agree with plan.    ___________________________________________   FINAL CLINICAL IMPRESSION(S) / ED DIAGNOSES   Final diagnoses:  Nonspecific chest pain              Note: This dictation was prepared with Dragon dictation. Any transcriptional errors that result from this process are unintentional    Governor Rooks, MD 02/01/16 1340

## 2016-02-01 NOTE — ED Triage Notes (Signed)
PT to ED via  EMS after having 30 min episode of chest pain at home this morning while getting in the shower.  Other symptoms include dizziness, bilateral facial numbness and "feeling like I'm about to pass out".  Pt denies chest pain, dizziness, numbness on arrival to ED and is in NAD at this time.  Alert and oriented.

## 2016-04-28 IMAGING — CT CT HEAD W/O CM
3 of 4 series · 17 of 30 positions shown, 19 images · non-contrast
Comparison: May 10, 2013

CLINICAL DATA: Facial numbness

EXAM:
CT HEAD WITHOUT CONTRAST
TECHNIQUE: Contiguous axial images were obtained from the base of the skull
through the vertex without intravenous contrast.

[Series 2: head wo · axial · 0.40mm/px · z∈[+367,+475]mm · 8 of 32 slices shown, 10 images]
[im 4/32  brain]
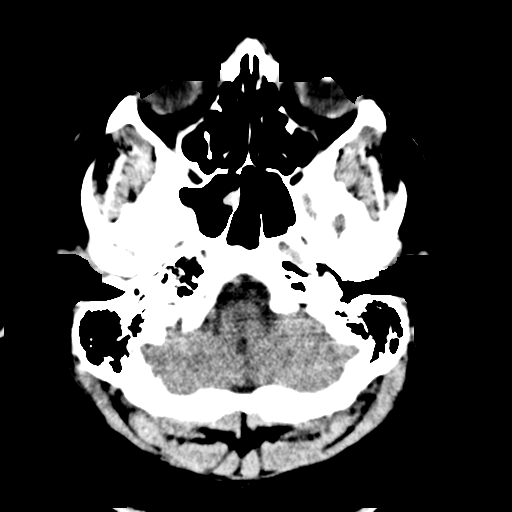
[im 4/32  bone]
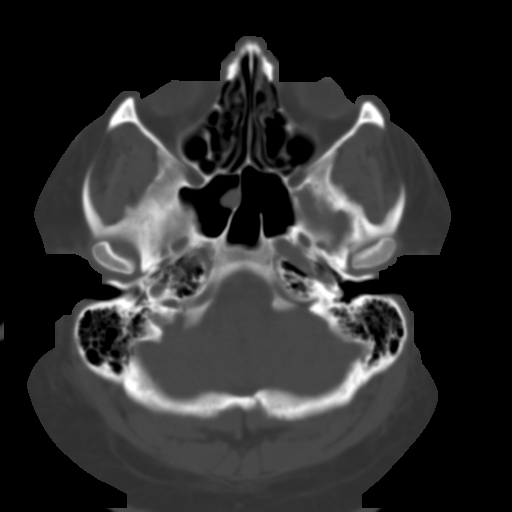
[im 7/32  brain]
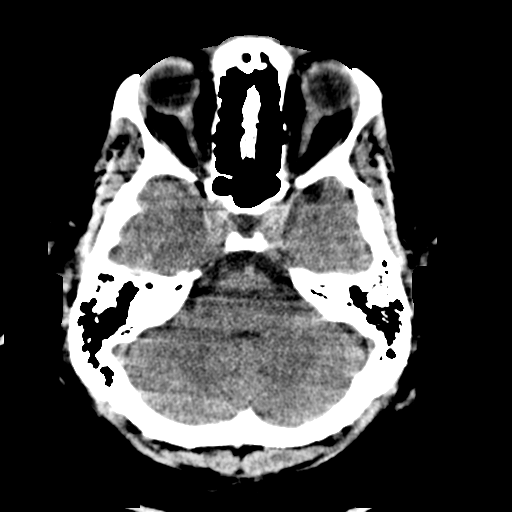
[im 11/32  brain]
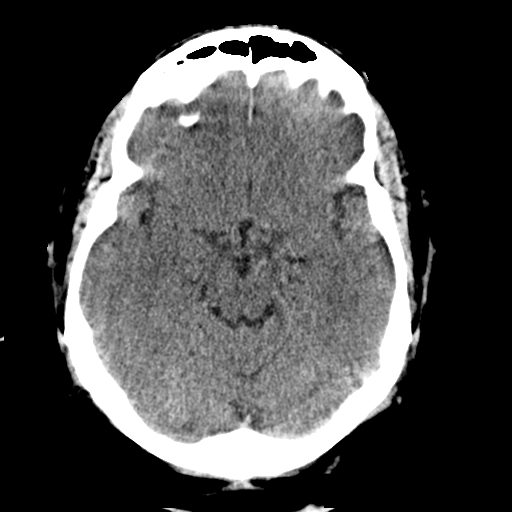
[im 14/32  brain]
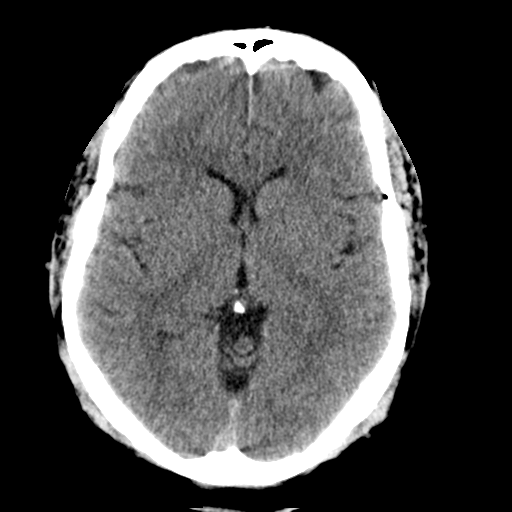
[im 18/32  brain]
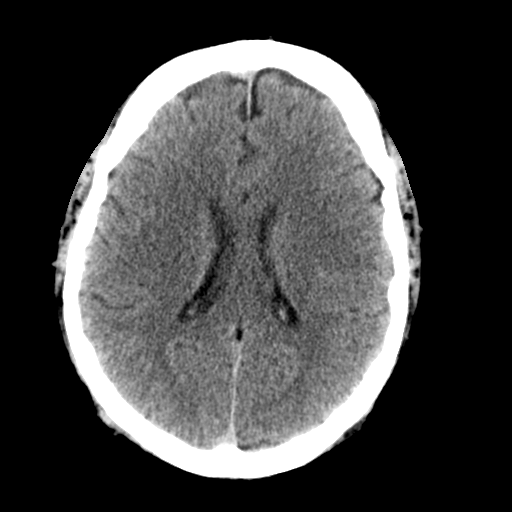
[im 18/32  bone]
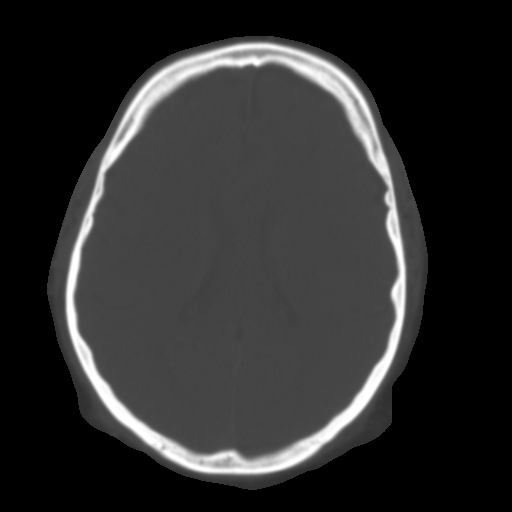
[im 21/32  brain]
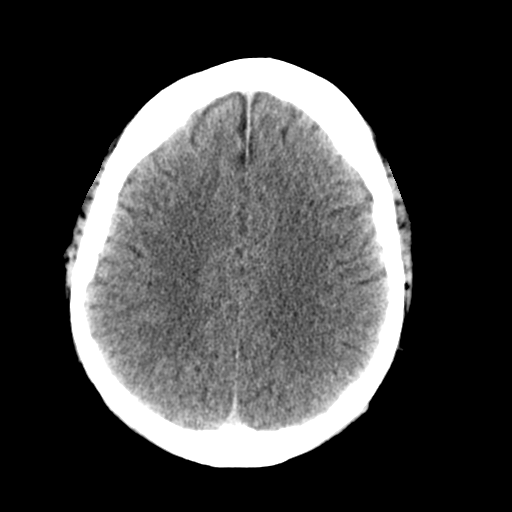
[im 25/32  brain]
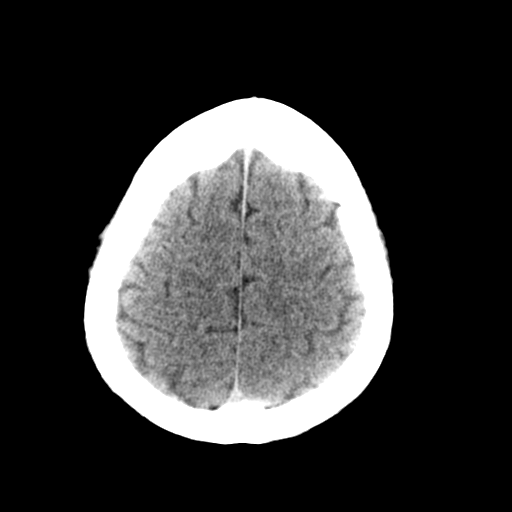
[im 28/32  brain]
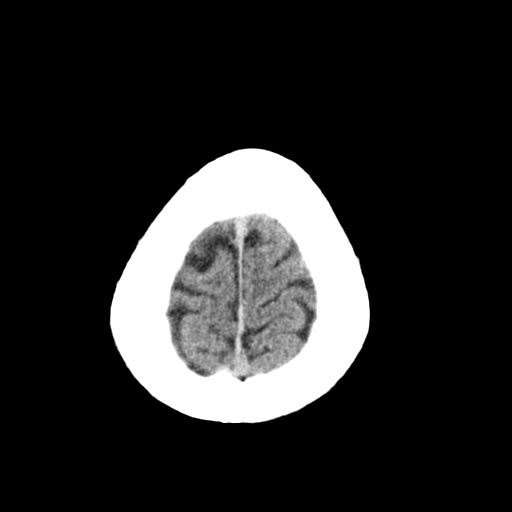

[Series 4: soft tissue · axial · 0.42mm/px · z∈[+359,+409]mm · 4 of 18 slices shown]
[im 4/18  brain]
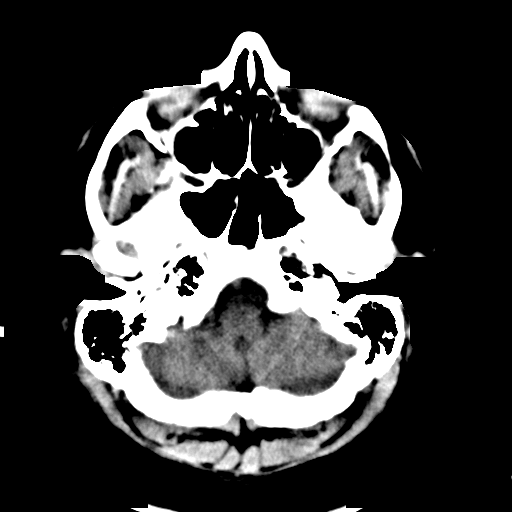
[im 7/18  brain]
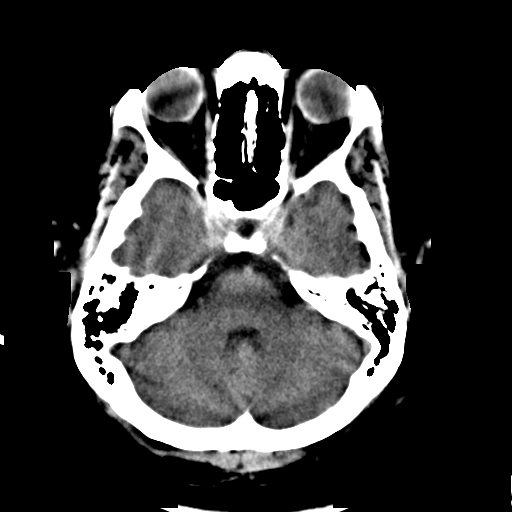
[im 11/18  brain]
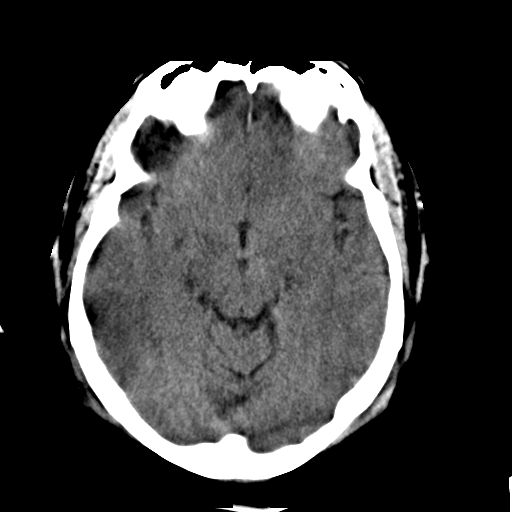
[im 14/18  brain]
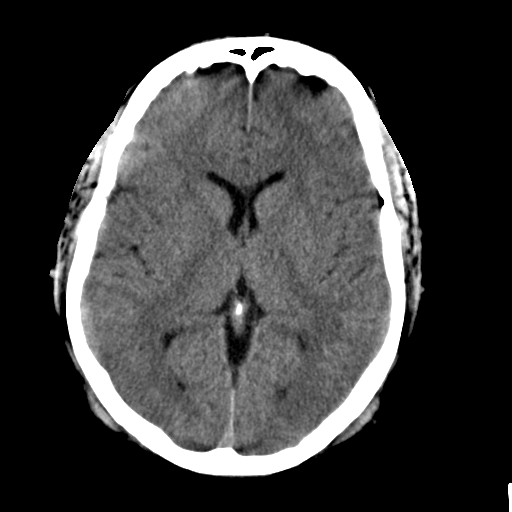

[Series 6: soft tissue repeat recon · axial · 0.42mm/px · z∈[+392,+459]mm · 5 of 22 slices shown]
[im 4/22  brain]
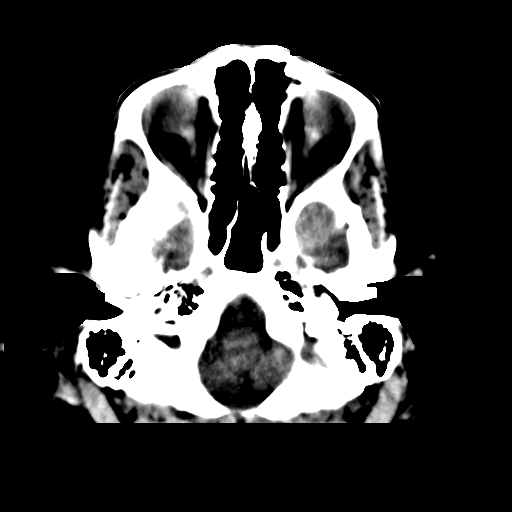
[im 8/22  brain]
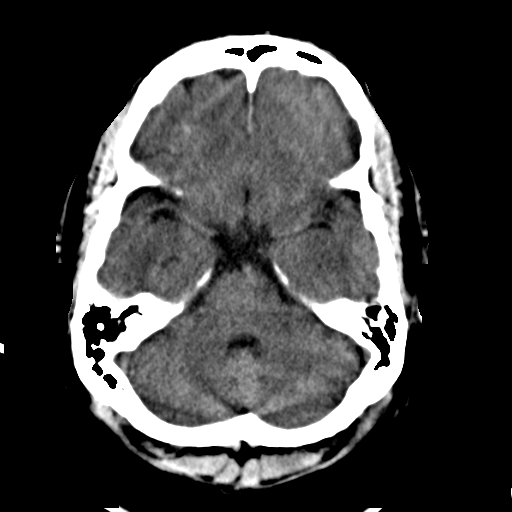
[im 11/22  brain]
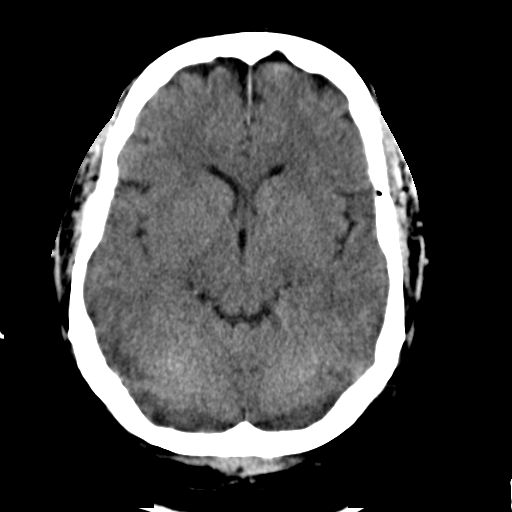
[im 15/22  brain]
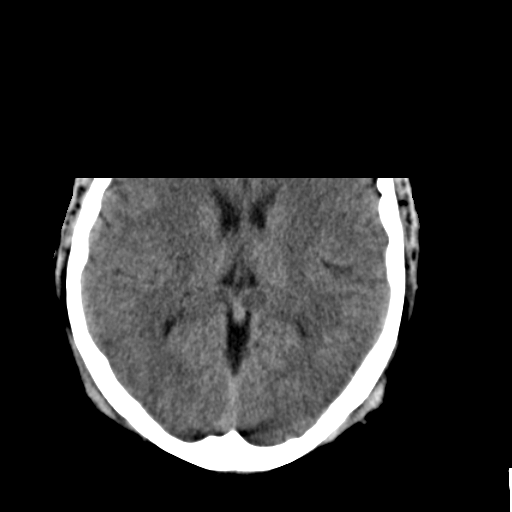
[im 18/22  brain]
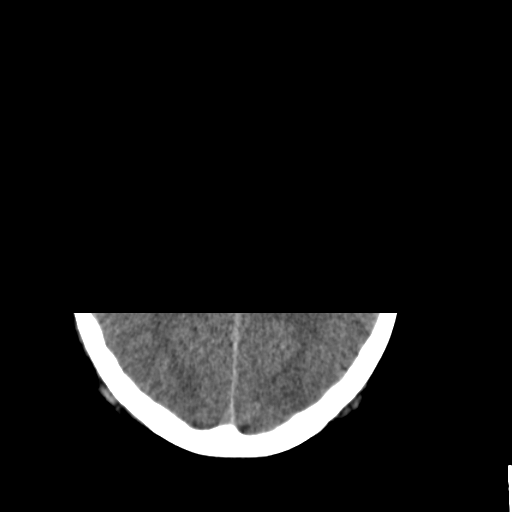

[17 of 30 positions shown; findings below may reference images not displayed]

FINDINGS: The ventricles are normal in size and configuration. There is no
intracranial mass, hemorrhage, extra-axial fluid collection, or
midline shift. The gray-white compartments are normal. No acute
infarct evident. The middle cerebral artery attenuation is symmetric
and felt to be normal bilaterally. Bony calvarium appears intact.
The visualized mastoid air cells are clear. There is a retention
cyst in the right sphenoid sinus measuring 9 x 8 mm.
IMPRESSION: Small retention cyst in the right sphenoid sinus. No intracranial
mass, hemorrhage, or focal gray - white compartment lesions/acute
appearing infarct.

Critical Value/emergent results were called by telephone at the time
of interpretation on 05/09/2015 at [DATE] to Dr. POSZMIK SEMJEN ,
who verbally acknowledged these results.

## 2016-08-25 ENCOUNTER — Encounter: Payer: Self-pay | Admitting: Emergency Medicine

## 2016-08-25 ENCOUNTER — Emergency Department
Admission: EM | Admit: 2016-08-25 | Discharge: 2016-08-25 | Disposition: A | Discharge status: Home / Self Care | Payer: Self-pay | Attending: Emergency Medicine | Admitting: Emergency Medicine

## 2016-08-25 ENCOUNTER — Emergency Department: Payer: Self-pay

## 2016-08-25 DIAGNOSIS — R0789 Other chest pain: Secondary | ICD-10-CM | POA: Insufficient documentation

## 2016-08-25 DIAGNOSIS — R079 Chest pain, unspecified: Secondary | ICD-10-CM

## 2016-08-25 LAB — CBC
HCT: 42.4 % (ref 40.0–52.0)
Hemoglobin: 14 g/dL (ref 13.0–18.0)
MCH: 28.4 pg (ref 26.0–34.0)
MCHC: 33.1 g/dL (ref 32.0–36.0)
MCV: 85.9 fL (ref 80.0–100.0)
PLATELETS: 177 10*3/uL (ref 150–440)
RBC: 4.94 MIL/uL (ref 4.40–5.90)
RDW: 14.8 % — AB (ref 11.5–14.5)
WBC: 7 10*3/uL (ref 3.8–10.6)

## 2016-08-25 LAB — BASIC METABOLIC PANEL
ANION GAP: 7 (ref 5–15)
BUN: 15 mg/dL (ref 6–20)
CHLORIDE: 108 mmol/L (ref 101–111)
CO2: 25 mmol/L (ref 22–32)
Calcium: 8.3 mg/dL — ABNORMAL LOW (ref 8.9–10.3)
Creatinine, Ser: 1 mg/dL (ref 0.61–1.24)
Glucose, Bld: 102 mg/dL — ABNORMAL HIGH (ref 65–99)
POTASSIUM: 4 mmol/L (ref 3.5–5.1)
SODIUM: 140 mmol/L (ref 135–145)

## 2016-08-25 LAB — TROPONIN I: Troponin I: <0.03 ng/mL (ref <0.03)

## 2016-08-25 NOTE — ED Triage Notes (Signed)
Pt presents to ED c/o chest pain since 1-2 pm today, upper right chest  pressure, intermittent in nature

## 2016-08-25 NOTE — ED Provider Notes (Signed)
[facility] Emergency Department Provider Note   ____________________________________________    I have reviewed the triage vital signs and the nursing notes.   HISTORY  Chief Complaint Chest Pain     HPI Wyatt Mckinney is a 41 y.o. male who presents with complaints of mild chest pressure which she says started approximately 2 PM today. He reports it comes and goes. He states he has had this before, most recently  6 months ago, no follow-up with cardiologist. No fevers or chills. No cough or shortness of breath. No recent travel. No pleurisy. No calf pain or swelling. No diaphoresis, chest pressure is not related to exertion   No past medical history on file.  There are no active problems to display for this patient.   No past surgical history on file.  Prior to Admission medications   Medication Sig Start Date End Date Taking? Authorizing Provider  HYDROcodone-acetaminophen (NORCO/VICODIN) 5-325 MG per tablet Take 2 tablets by mouth every 4 (four) hours as needed. Patient not taking: Reported on 04/18/2015 06/23/14   Teressa Lower, NP  ibuprofen (ADVIL,MOTRIN) 800 MG tablet Take 800 mg by mouth every 8 (eight) hours as needed for moderate pain.    Historical Provider, MD  naproxen (NAPROSYN) 500 MG tablet Take 1 tablet (500 mg total) by mouth 2 (two) times daily. Patient not taking: Reported on 04/18/2015 05/15/13   Santiago Glad, PA-C  naproxen (NAPROSYN) 500 MG tablet Take 1 tablet (500 mg total) by mouth 2 (two) times daily with a meal. Patient not taking: Reported on 04/18/2015 06/19/14   Francee Piccolo, PA-C  traMADol (ULTRAM) 50 MG tablet Take 1 tablet (50 mg total) by mouth every 6 (six) hours as needed. Patient not taking: Reported on 04/18/2015 05/15/13   Santiago Glad, PA-C  traMADol (ULTRAM) 50 MG tablet Take 1 tablet (50 mg total) by mouth every 6 (six) hours as needed. Patient not taking: Reported on 04/18/2015 06/19/14    Francee Piccolo, PA-C     Allergies Patient has no known allergies.  No family history on file.  Social History Social History  Substance Use Topics  . Smoking status: Never Smoker  . Smokeless tobacco: Never Used  . Alcohol use No    Review of Systems  Constitutional: No fever/chillsBut no diaphoresis  Cardiovascular: As above Respiratory: Denies shortness of breath. Gastrointestinal: No abdominal pain.   Genitourinary: Negative for dysuria. Musculoskeletal: Negative for back pain. Skin: Negative for rash. Neurological: Negative for headaches   10-point ROS otherwise negative.  ____________________________________________  PHYSICAL EXAM:  VITAL SIGNS: ED Triage Vitals [08/25/16 1923]  Enc Vitals Group     BP (!) 156/95     Pulse Rate 77     Resp (!) 21     Temp 98.7 F (37.1 C)     Temp Source Oral     SpO2 96 %     Weight (!) 360 lb (163.3 kg)     Height 5\' 8"  (1.727 m)     Head Circumference      Peak Flow      Pain Score      Pain Loc      Pain Edu?      Excl. in GC?     Constitutional: Alert and oriented. No acute distress. Pleasant and interactive Eyes: Conjunctivae are normal.  Head: Atraumatic. Nose: No congestion/rhinnorhea. Mouth/Throat: Mucous membranes are moist.    Cardiovascular: Normal rate, regular rhythm. Grossly normal heart sounds.  Good  peripheral circulation. Respiratory: Normal respiratory effort.  No retractions. Lungs CTAB. Gastrointestinal: Soft and nontender. No distention.  No CVA tenderness.  Musculoskeletal: No lower extremity tenderness nor edema.  Warm and well perfused Neurologic:  Normal speech and language. No gross focal neurologic deficits are appreciated.  Skin:  Skin is warm, dry and intact. No rash noted. Psychiatric: Mood and affect are normal. Speech and behavior are normal.  ____________________________________________   LABS (all labs ordered are listed, but only abnormal results are  displayed)  Labs Reviewed  BASIC METABOLIC PANEL  CBC  TROPONIN I   ____________________________________________  EKG  ED ECG REPORT I, Jene EveryKINNER, Anetra Czerwinski, the attending physician, personally viewed and interpreted this ECG.  Date: 08/25/2016 EKG Time: 7:24 PM Rate: 79 Rhythm: normal sinus rhythm QRS Axis: normal Intervals: normal ST/T Wave abnormalities: normal Conduction Disturbances: none Narrative Interpretation: unremarkable  ____________________________________________  RADIOLOGY  Chest x-ray unremarkable ____________________________________________   PROCEDURES  Procedure(s) performed: No    Critical Care performed: No ____________________________________________   INITIAL IMPRESSION / ASSESSMENT AND PLAN / ED COURSE  Pertinent labs & imaging results that were available during my care of the patient were reviewed by me and considered in my medical decision making (see chart for details).  Patient presents with chest pressure which is mild. No shortness of breath. No pleurisy. Has had this before and been worked up in the emergency department but has not followed with cardiology. We will perform chest x-ray, evaluate labs and monitor carefully.    Troponins negative 2. Patient feels much better, he is asymptomatic. Discussed with him the importance of outpatient follow-up with Dr. Lennette BihariKohn of cardiology, he agrees and will call tomorrow ____________________________________________   FINAL CLINICAL IMPRESSION(S) / ED DIAGNOSES  Final diagnoses:  Nonspecific chest pain      NEW MEDICATIONS STARTED DURING THIS VISIT:  New Prescriptions   No medications on file     Note:  This document was prepared using Dragon voice recognition software and may include unintentional dictation errors.    Jene Everyobert Jevon Shells, MD 08/25/16 [phone]

## 2016-08-25 NOTE — ED Notes (Signed)
Pt with xray 

## 2017-12-09 ENCOUNTER — Emergency Department: Payer: Self-pay

## 2017-12-09 ENCOUNTER — Encounter: Payer: Self-pay | Admitting: Emergency Medicine

## 2017-12-09 ENCOUNTER — Emergency Department
Admission: EM | Admit: 2017-12-09 | Discharge: 2017-12-09 | Disposition: A | Discharge status: Home / Self Care | Payer: Self-pay | Attending: Emergency Medicine | Admitting: Emergency Medicine

## 2017-12-09 ENCOUNTER — Other Ambulatory Visit: Payer: Self-pay

## 2017-12-09 DIAGNOSIS — R079 Chest pain, unspecified: Secondary | ICD-10-CM | POA: Insufficient documentation

## 2017-12-09 LAB — BASIC METABOLIC PANEL
Anion gap: 7 (ref 5–15)
BUN: 14 mg/dL (ref 6–20)
CALCIUM: 8.4 mg/dL — AB (ref 8.9–10.3)
CO2: 25 mmol/L (ref 22–32)
Chloride: 107 mmol/L (ref 101–111)
Creatinine, Ser: 0.81 mg/dL (ref 0.61–1.24)
GFR calc Af Amer: >60 mL/min (ref >60)
Glucose, Bld: 136 mg/dL — ABNORMAL HIGH (ref 65–99)
Potassium: 4.2 mmol/L (ref 3.5–5.1)
SODIUM: 139 mmol/L (ref 135–145)

## 2017-12-09 LAB — CBC
HCT: 44.9 % (ref 40.0–52.0)
Hemoglobin: 14.9 g/dL (ref 13.0–18.0)
MCH: 29 pg (ref 26.0–34.0)
MCHC: 33.1 g/dL (ref 32.0–36.0)
MCV: 87.7 fL (ref 80.0–100.0)
PLATELETS: 184 10*3/uL (ref 150–440)
RBC: 5.12 MIL/uL (ref 4.40–5.90)
RDW: 15.1 % — AB (ref 11.5–14.5)
WBC: 5.7 10*3/uL (ref 3.8–10.6)

## 2017-12-09 LAB — TROPONIN I: Troponin I: <0.03 ng/mL (ref <0.03)

## 2017-12-09 MED ORDER — ASPIRIN 81 MG PO CHEW
324.0000 mg | CHEWABLE_TABLET | Freq: Once | ORAL | Status: AC
  Administered 2017-12-09: 324 mg via ORAL
  Filled 2017-12-09: qty 4

## 2017-12-09 NOTE — Care Management Note (Signed)
Case Management Note  Patient Details  Name: Cecilie LowersGregory J Lory MRN: [id number] Date of Birth: 01/05/1976  Subjective/Objective:       Patient without PCP listed. [facility] and medication management application given.             Action/Plan:   Expected Discharge Date:                  Expected Discharge Plan:     In-House Referral:     Discharge planning Services  CM Consult, [facility]  Post Acute Care Choice:    Choice offered to:     DME Arranged:    DME Agency:     HH Arranged:    HH Agency:     Status of Service:  Completed, signed off  If discussed at [employer] Length of Stay Meetings, dates discussed:    Additional Comments:  Virgel ManifoldJosh A Sheretta Grumbine, RN 12/09/2017, 10:28 AM

## 2017-12-09 NOTE — ED Notes (Signed)
Pt ambulatory to room 18 without difficulty.

## 2017-12-09 NOTE — ED Provider Notes (Signed)
[facility] Emergency Department Provider Note  ____________________________________________  Time seen: Approximately 8:27 AM  I have reviewed the triage vital signs and the nursing notes.   HISTORY  Chief Complaint Chest Pain   HPI Wyatt Mckinney is a 42 y.o. male with a history of obesity who presents for evaluation of chest pain.  Patient reports intermittent episodes of left-sided sharp chest pain yesterday evening.  These episodes lasted a few seconds at a time and resolved without intervention.  This morning while looking in the mirror patient started having again sharp left-sided chest pain.  This pain lasted several hours which prompted the visit to the emergency room.  No diaphoresis, nausea, vomiting, shortness of breath, dizziness.  The pain was not pleuritic in nature.  Patient denies personal or family history of cardiac disease or blood clots.  No recent travel immobilization, no leg pain or swelling, no hemoptysis, no history of cancer, no exogenous hormones.  The pain has now resolved.  Patient reports having several episodes of chest pain similar however they usually only last a few seconds and this is the first time that he lasted several hours.  Patient has no history of smoking.  Has not seen a doctor in several years.  PMH None - reviewed   Prior to Admission medications   Medication Sig Start Date End Date Taking? Authorizing Provider  ibuprofen (ADVIL,MOTRIN) 200 MG tablet Take 400-600 mg by mouth every 6 (six) hours as needed for mild pain or moderate pain.   Yes [provider]    Allergies Patient has no known allergies.  FH No h/o CAD or PE/DVT  Social History Social History   Tobacco Use  . Smoking status: Never Smoker  . Smokeless tobacco: Never Used  Substance Use Topics  . Alcohol use: No  . Drug use: No    Review of Systems  Constitutional: Negative for fever. Eyes: Negative for visual changes. ENT:  Negative for sore throat. Neck: No neck pain  Cardiovascular: + chest pain. Respiratory: Negative for shortness of breath. Gastrointestinal: Negative for abdominal pain, vomiting or diarrhea. Genitourinary: Negative for dysuria. Musculoskeletal: Negative for back pain. Skin: Negative for rash. Neurological: Negative for headaches, weakness or numbness. Psych: No SI or HI  ____________________________________________   PHYSICAL EXAM:  VITAL SIGNS: ED Triage Vitals  Enc Vitals Group     BP 12/09/17 0606 138/87     Pulse Rate 12/09/17 0606 69     Resp 12/09/17 0606 20     Temp 12/09/17 0606 97.9 F (36.6 C)     Temp Source 12/09/17 0606 Oral     SpO2 12/09/17 0606 97 %     Weight 12/09/17 0604 (!) 389 lb (176.4 kg)     Height 12/09/17 0604 5\' 9"  (1.753 m)     Head Circumference --      Peak Flow --      Pain Score 12/09/17 0604 10     Pain Loc --      Pain Edu? --      Excl. in GC? --     Constitutional: Alert and oriented, morbidly obese, no apparent distress. HEENT:      Head: Normocephalic and atraumatic.         Eyes: Conjunctivae are normal. Sclera is non-icteric.       Mouth/Throat: Mucous membranes are moist.       Neck: Supple with no signs of meningismus. Cardiovascular: Regular rate and rhythm. No murmurs, gallops,  or rubs. 2+ symmetrical distal pulses are present in all extremities. No JVD. Respiratory: Normal respiratory effort. Lungs are clear to auscultation bilaterally. No wheezes, crackles, or rhonchi.  Gastrointestinal: Obese, non tender, and non distended with positive bowel sounds. No rebound or guarding. Musculoskeletal: Nontender with normal range of motion in all extremities. No edema, cyanosis, or erythema of extremities. Neurologic: Normal speech and language. Face is symmetric. Moving all extremities. No gross focal neurologic deficits are appreciated. Skin: Skin is warm, dry and intact. No rash noted. Psychiatric: Mood and affect are normal.  Speech and behavior are normal.  ____________________________________________   LABS (all labs ordered are listed, but only abnormal results are displayed)  Labs Reviewed  BASIC METABOLIC PANEL - Abnormal; Notable for the following components:      Result Value   Glucose, Bld 136 (*)    Calcium 8.4 (*)    All other components within normal limits  CBC - Abnormal; Notable for the following components:   RDW 15.1 (*)    All other components within normal limits  TROPONIN I  TROPONIN I   ____________________________________________  EKG  ED ECG REPORT I, Nita Sicklearolina Kiree Dejarnette, the attending physician, personally viewed and interpreted this ECG.  6:08 -normal sinus rhythm, rate of 75, normal intervals, normal axis, no ST elevations or depressions.  07:50 -normal sinus rhythm, rate of 70, normal intervals, normal axis, no ST elevations or depressions, T wave inversion in lead III.  Unchanged from prior ____________________________________________  RADIOLOGY  I have personally reviewed the images performed during this visit and I agree with the Radiologist's read.   Interpretation by Radiologist:  Dg Chest 2 View  Result Date: 12/09/2017 CLINICAL DATA:  Left-sided chest pain. EXAM: CHEST - 2 VIEW COMPARISON:  Radiograph 08/25/2016 FINDINGS: Low lung volumes. The cardiomediastinal contours are normal. Pulmonary vasculature is normal. No consolidation, pleural effusion, or pneumothorax. No acute osseous abnormalities are seen. IMPRESSION: Low lung volumes without acute chest findings. Electronically Signed   By: Rubye OaksMelanie  Ehinger M.D.   On: 12/09/2017 06:36     ____________________________________________   PROCEDURES  Procedure(s) performed: None Procedures Critical Care performed:  None ____________________________________________   INITIAL IMPRESSION / ASSESSMENT AND PLAN / ED COURSE   42 y.o. male with a history of obesity who presents for evaluation of chest pain.   Patient is currently chest pain-free.  2 EKGs done while patient was having pain showed no evidence of ischemia.  Low suspicion for ACS due to description of the pain however patient is obese and vitals here show mild hypertension and labs showed glucose of 130 which according to patient is a fasting value.  Patient has not seen a doctor for several years.  Had a long discussion with him that he might have hypertension and diabetes which would increase his risk of ACS as well.  Recommended close follow-up with primary care doctor for an annual exam and explained the risks associated with untreated hypertension and diabetes.  Patient is good to be referred to primary care doctor on discharge.  Patient is Wells low risk and PERC negative for pulmonary embolism.  Abdomen is soft and nontender with low clinical suspicion for abdominal pathology such as gastritis, peptic ulcer disease, pancreatitis.  Chest x-ray shows no evidence of pneumothorax, pneumonia, or rib fractures.  Will monitor patient closely on telemetry, will get troponin x2 and if all negative patient will be discharged home with follow-up with primary care doctor and cardiology.  Patient was given a full dose  of aspirin.    _________________________ 10:25 AM on 12/09/2017 -----------------------------------------  Second troponin is negative.  Patient remains asymptomatic.  Patient will be referred to primary care doctor and cardiology.  Discussed return precautions.   As part of my medical decision making, I reviewed the following data within the electronic MEDICAL RECORD NUMBER Nursing notes reviewed and incorporated, Labs reviewed , EKG interpreted , Old EKG reviewed, Old chart reviewed, Radiograph reviewed , Notes from prior ED visits and [place] Controlled Substance Database    Pertinent labs & imaging results that were available during my care of the patient were reviewed by me and considered in my medical decision making (see chart for  details).    ____________________________________________   FINAL CLINICAL IMPRESSION(S) / ED DIAGNOSES  Final diagnoses:  Chest pain, unspecified type      NEW MEDICATIONS STARTED DURING THIS VISIT:  ED Discharge Orders    None       Note:  This document was prepared using Dragon voice recognition software and may include unintentional dictation errors.    Don Perking, [place], MD 12/09/17 1025

## 2017-12-09 NOTE — ED Triage Notes (Signed)
Patient ambulatory to triage with steady gait, without difficulty or distress noted; pt reports left sided CP, nonradiating since yesterday; denies any accomp symptoms; denies hx of same

## 2017-12-09 NOTE — Discharge Instructions (Addendum)
As I explained to you your labs show that you may have early diabetes and also high blood pressure.  It is very important that you follow-up with a primary care doctor for an annual exam.  At this time, your evaluation here did not show that you had a heart attack.  Please follow-up with cardiology for further evaluation.  Return to the emergency room for new or worsening chest pain, shortness of breath or dizziness.

## 2019-12-05 ENCOUNTER — Other Ambulatory Visit: Payer: Self-pay

## 2019-12-05 ENCOUNTER — Emergency Department: Payer: BLUE CROSS/BLUE SHIELD

## 2019-12-05 DIAGNOSIS — R0902 Hypoxemia: Secondary | ICD-10-CM | POA: Diagnosis present

## 2019-12-05 DIAGNOSIS — Z9119 Patient's noncompliance with other medical treatment and regimen: Secondary | ICD-10-CM

## 2019-12-05 DIAGNOSIS — I10 Essential (primary) hypertension: Secondary | ICD-10-CM | POA: Diagnosis present

## 2019-12-05 DIAGNOSIS — Z6841 Body Mass Index (BMI) 40.0 and over, adult: Secondary | ICD-10-CM

## 2019-12-05 DIAGNOSIS — T407X1A Poisoning by cannabis (derivatives), accidental (unintentional), initial encounter: Principal | ICD-10-CM | POA: Diagnosis present

## 2019-12-05 DIAGNOSIS — Z8616 Personal history of COVID-19: Secondary | ICD-10-CM

## 2019-12-05 DIAGNOSIS — Z791 Long term (current) use of non-steroidal anti-inflammatories (NSAID): Secondary | ICD-10-CM

## 2019-12-05 DIAGNOSIS — Z79899 Other long term (current) drug therapy: Secondary | ICD-10-CM

## 2019-12-05 DIAGNOSIS — E662 Morbid (severe) obesity with alveolar hypoventilation: Secondary | ICD-10-CM | POA: Diagnosis present

## 2019-12-05 LAB — CBC
HCT: 51 % (ref 39.0–52.0)
Hemoglobin: 15.4 g/dL (ref 13.0–17.0)
MCH: 27.9 pg (ref 26.0–34.0)
MCHC: 30.2 g/dL (ref 30.0–36.0)
MCV: 92.6 fL (ref 80.0–100.0)
Platelets: 193 10*3/uL (ref 150–400)
RBC: 5.51 MIL/uL (ref 4.22–5.81)
RDW: 15.1 % (ref 11.5–15.5)
WBC: 7.6 10*3/uL (ref 4.0–10.5)
nRBC: 0 % (ref 0.0–0.2)

## 2019-12-05 LAB — BASIC METABOLIC PANEL
Anion gap: 9 (ref 5–15)
BUN: 14 mg/dL (ref 6–20)
CO2: 29 mmol/L (ref 22–32)
Calcium: 8.6 mg/dL — ABNORMAL LOW (ref 8.9–10.3)
Chloride: 100 mmol/L (ref 98–111)
Creatinine, Ser: 1.01 mg/dL (ref 0.61–1.24)
GFR calc Af Amer: >60 mL/min (ref >60)
GFR calc non Af Amer: >60 mL/min (ref >60)
Glucose, Bld: 148 mg/dL — ABNORMAL HIGH (ref 70–99)
Potassium: 4.1 mmol/L (ref 3.5–5.1)
Sodium: 138 mmol/L (ref 135–145)

## 2019-12-05 NOTE — ED Notes (Signed)
Pt states "my oxygen is always low" and "they leave it alone". Pt placed on 2L [place].

## 2019-12-05 NOTE — ED Triage Notes (Signed)
Pt comes EMS from home with weakness, SOB, dizziness starting today about an hour ago. Pt took THC gummy about 2 hours ago. Pt has never had one before. Pt AOx4. Oxygen sat was originally in the 80s per EMS but pt now 94% on RA after being placed on 3L [place]. Pt has not had any of his meds in 4 weeks. CBG 147.

## 2019-12-06 ENCOUNTER — Emergency Department: Payer: BLUE CROSS/BLUE SHIELD

## 2019-12-06 ENCOUNTER — Inpatient Hospital Stay: Payer: BLUE CROSS/BLUE SHIELD

## 2019-12-06 ENCOUNTER — Encounter: Payer: Self-pay | Admitting: Radiology

## 2019-12-06 ENCOUNTER — Inpatient Hospital Stay
Admission: EM | Admit: 2019-12-06 | Discharge: 2019-12-07 | DRG: 918 | Disposition: A | Discharge status: Home / Self Care | Payer: BLUE CROSS/BLUE SHIELD | Attending: Hospitalist | Admitting: Hospitalist

## 2019-12-06 DIAGNOSIS — Z9119 Patient's noncompliance with other medical treatment and regimen: Secondary | ICD-10-CM | POA: Diagnosis not present

## 2019-12-06 DIAGNOSIS — T407X1A Poisoning by cannabis (derivatives), accidental (unintentional), initial encounter: Secondary | ICD-10-CM | POA: Diagnosis not present

## 2019-12-06 DIAGNOSIS — R600 Localized edema: Secondary | ICD-10-CM

## 2019-12-06 DIAGNOSIS — G4733 Obstructive sleep apnea (adult) (pediatric): Secondary | ICD-10-CM | POA: Diagnosis not present

## 2019-12-06 DIAGNOSIS — R0902 Hypoxemia: Secondary | ICD-10-CM | POA: Diagnosis not present

## 2019-12-06 DIAGNOSIS — Z791 Long term (current) use of non-steroidal anti-inflammatories (NSAID): Secondary | ICD-10-CM | POA: Diagnosis not present

## 2019-12-06 DIAGNOSIS — I10 Essential (primary) hypertension: Secondary | ICD-10-CM | POA: Diagnosis present

## 2019-12-06 DIAGNOSIS — R609 Edema, unspecified: Secondary | ICD-10-CM

## 2019-12-06 DIAGNOSIS — Z9981 Dependence on supplemental oxygen: Secondary | ICD-10-CM

## 2019-12-06 DIAGNOSIS — Z79899 Other long term (current) drug therapy: Secondary | ICD-10-CM | POA: Diagnosis not present

## 2019-12-06 DIAGNOSIS — E662 Morbid (severe) obesity with alveolar hypoventilation: Secondary | ICD-10-CM | POA: Diagnosis present

## 2019-12-06 DIAGNOSIS — Z6841 Body Mass Index (BMI) 40.0 and over, adult: Secondary | ICD-10-CM | POA: Diagnosis not present

## 2019-12-06 DIAGNOSIS — Z8616 Personal history of COVID-19: Secondary | ICD-10-CM | POA: Diagnosis not present

## 2019-12-06 LAB — URINALYSIS, COMPLETE (UACMP) WITH MICROSCOPIC
Bacteria, UA: NONE SEEN
Bilirubin Urine: NEGATIVE
Glucose, UA: NEGATIVE mg/dL
Hgb urine dipstick: NEGATIVE
Ketones, ur: NEGATIVE mg/dL
Leukocytes,Ua: NEGATIVE
Nitrite: NEGATIVE
Protein, ur: NEGATIVE mg/dL
Specific Gravity, Urine: >1.046 — ABNORMAL HIGH (ref 1.005–1.030)
Squamous Epithelial / LPF: NONE SEEN (ref 0–5)
WBC, UA: NONE SEEN WBC/hpf (ref 0–5)
pH: 6 (ref 5.0–8.0)

## 2019-12-06 LAB — BRAIN NATRIURETIC PEPTIDE: B Natriuretic Peptide: 14.7 pg/mL (ref 0.0–100.0)

## 2019-12-06 LAB — FIBRIN DERIVATIVES D-DIMER (ARMC ONLY): Fibrin derivatives D-dimer (ARMC): 516.34 ng/mL (FEU) — ABNORMAL HIGH (ref 0.00–499.00)

## 2019-12-06 LAB — SARS CORONAVIRUS 2 BY RT PCR (HOSPITAL ORDER, PERFORMED IN ~~LOC~~ HOSPITAL LAB): SARS Coronavirus 2: NEGATIVE

## 2019-12-06 MED ORDER — SODIUM CHLORIDE 0.9% FLUSH: 3.0000 mL | INTRAVENOUS | Status: DC | PRN

## 2019-12-06 MED ORDER — FUROSEMIDE 10 MG/ML IJ SOLN
40.0000 mg | Freq: Every day | INTRAMUSCULAR | Status: DC
  Administered 2019-12-06 – 2019-12-07 (×2): 40 mg via INTRAVENOUS
  Filled 2019-12-06 (×2): qty 4

## 2019-12-06 MED ORDER — CARVEDILOL 3.125 MG PO TABS
3.1250 mg | ORAL_TABLET | Freq: Two times a day (BID) | ORAL | Status: DC
  Administered 2019-12-06 – 2019-12-07 (×4): 3.125 mg via ORAL
  Filled 2019-12-06 (×4): qty 1

## 2019-12-06 MED ORDER — TRAZODONE HCL 50 MG PO TABS: 25.0000 mg | ORAL_TABLET | Freq: Every evening | ORAL | Status: DC | PRN

## 2019-12-06 MED ORDER — ACETAMINOPHEN 650 MG RE SUPP: 650.0000 mg | Freq: Four times a day (QID) | RECTAL | Status: DC | PRN

## 2019-12-06 MED ORDER — ONDANSETRON HCL 4 MG/2ML IJ SOLN: 4.0000 mg | Freq: Four times a day (QID) | INTRAMUSCULAR | Status: DC | PRN

## 2019-12-06 MED ORDER — ONDANSETRON HCL 4 MG PO TABS: 4.0000 mg | ORAL_TABLET | Freq: Four times a day (QID) | ORAL | Status: DC | PRN

## 2019-12-06 MED ORDER — IOHEXOL 350 MG/ML SOLN
100.0000 mL | Freq: Once | INTRAVENOUS | Status: AC | PRN
  Administered 2019-12-06: 100 mL via INTRAVENOUS

## 2019-12-06 MED ORDER — SODIUM CHLORIDE 0.9 % IV SOLN: 250.0000 mL | INTRAVENOUS | Status: DC | PRN

## 2019-12-06 MED ORDER — LABETALOL HCL 5 MG/ML IV SOLN: 20.0000 mg | INTRAVENOUS | Status: DC | PRN

## 2019-12-06 MED ORDER — HEPARIN SODIUM (PORCINE) 5000 UNIT/ML IJ SOLN
5000.0000 [IU] | Freq: Three times a day (TID) | INTRAMUSCULAR | Status: DC
  Administered 2019-12-06 – 2019-12-07 (×4): 5000 [IU] via SUBCUTANEOUS
  Filled 2019-12-06 (×4): qty 1

## 2019-12-06 MED ORDER — SODIUM CHLORIDE 0.9% FLUSH
3.0000 mL | Freq: Two times a day (BID) | INTRAVENOUS | Status: DC
  Administered 2019-12-06 – 2019-12-07 (×3): 3 mL via INTRAVENOUS

## 2019-12-06 MED ORDER — ACETAMINOPHEN 325 MG PO TABS: 650.0000 mg | ORAL_TABLET | Freq: Four times a day (QID) | ORAL | Status: DC | PRN

## 2019-12-06 NOTE — ED Notes (Signed)
Pt provided with urinal for urination.  

## 2019-12-06 NOTE — ED Notes (Signed)
Us remains at bedside

## 2019-12-06 NOTE — H&P (Signed)
[place] [facility] [place] NAME: Wyatt Mckinney    MR#:  [id number]  DATE OF BIRTH:  [birth year]  DATE OF ADMISSION:  12/06/2019  PRIMARY CARE PHYSICIAN: Patient, No Pcp Per   REQUESTING/REFERRING PHYSICIAN: Nance Pear, MD  CHIEF COMPLAINT:   Chief Complaint  Patient presents with   Weakness    HISTORY OF PRESENT ILLNESS:  Wyatt Mckinney  is a 44 y.o. male with a known history of morbid obesity and obstructive sleep apnea likely with chronic hypoxia, presented to the emergency room with acute onset of generalized weakness and dizziness after taking the gummy that is medicated with 600 mg with THC, with associated hypoxia to 86% while sitting.  The patient has history of obstructive sleep apnea and is supposed to be on CPAP but does not use it.  He was supposed to have a polysomnography study near future.  He denied any fever or chills.  He has lower extremity edema with no pain.  No recent travels.  No dysuria, oliguria or hematuria or flank pain.  No cough or wheezing or hemoptysis or chest pain.   Upon presentation to the emergency room blood pressure was 190/97 with a pulse currently 93% on 2 L of O2 by nasal cannula.  Pulse was 101 and respiratory rate was 26.  Labs revealed urinalysis with high urine specific gravity of more than 1046.  COVID-19 PCR came back negative.  Chest x-ray showed low lung volumes with no acute process a chest CTA was suboptimal due to body habitus and extensive respiratory motion and was basically nondiagnostic as follows: 1. Imaging quality is severely degraded due to patient body habitus, suboptimal contrast bolus timing, and extensive respiratory motion artifact. 2. Imaging of the pulmonary arteries is essentially nondiagnostic without large central filling defect and more peripheral defects are difficult to exclude. 3. Some atelectatic changes are present in the lungs without other acute intrathoracic abnormality.  He will be  admitted to a medical monitored bed for further evaluation and management.  PAST MEDICAL HISTORY:  Morbid obesity Obstructive sleep apnea  PAST SURGICAL HISTORY:  History reviewed. No pertinent surgical history.  No reported surgeries  SOCIAL HISTORY:   Social History   Tobacco Use   Smoking status: Never Smoker   Smokeless tobacco: Never Used  Substance Use Topics   Alcohol use: No    FAMILY HISTORY:  History reviewed. No pertinent family history.  No pertinent familial diseases.  DRUG ALLERGIES:  No Known Allergies  REVIEW OF SYSTEMS:   ROS As per history of present illness. All pertinent systems were reviewed above. Constitutional,  HEENT, cardiovascular, respiratory, GI, GU, musculoskeletal, neuro, psychiatric, endocrine,  integumentary and hematologic systems were reviewed and are otherwise  negative/unremarkable except for positive findings mentioned above in the HPI.   MEDICATIONS AT HOME:   Prior to Admission medications   Medication Sig Start Date End Date Taking? Authorizing Provider  carvedilol (COREG) 3.125 MG tablet Take by mouth. 01/26/19   [provider]  ibuprofen (ADVIL,MOTRIN) 200 MG tablet Take 400-600 mg by mouth every 6 (six) hours as needed for mild pain or moderate pain.    [provider]      VITAL SIGNS:  Blood pressure (!) 166/81, pulse (!) 120, temperature 99.3 F (37.4 C), temperature source Oral, resp. rate (!) 26, height 5\' 10"  (1.778 m), weight (!) 186 kg, SpO2 92 %.  PHYSICAL EXAMINATION:  Physical Exam  GENERAL:  44 y.o.-year-old morbidly obese African-American  male patient lying in the bed with no acute distress.  EYES: Pupils equal, round, reactive to light and accommodation. No scleral icterus. Extraocular muscles intact.  HEENT: Head atraumatic, normocephalic. Oropharynx and nasopharynx clear.  NECK:  Supple, no jugular venous distention. No thyroid enlargement, no tenderness.  LUNGS: Normal breath  sounds bilaterally, no wheezing, rales,rhonchi or crepitation. No use of accessory muscles of respiration.  CARDIOVASCULAR: Regular rate and rhythm, S1, S2 normal. No murmurs, rubs, or gallops.  ABDOMEN: Soft, nondistended, nontender. Bowel sounds present. No organomegaly or mass.  EXTREMITIES: 2+ bilateral lower extremity leg and pedal pitting edema, with no cyanosis, or clubbing.  NEUROLOGIC: Cranial nerves II through XII are intact. Muscle strength 5/5 in all extremities. Sensation intact. Gait not checked.  PSYCHIATRIC: The patient is alert and oriented x 3.  Normal affect and good eye contact. SKIN: No obvious rash, lesion, or ulcer.   LABORATORY PANEL:   CBC Recent Labs  Lab 12/05/19 2131  WBC 7.6  HGB 15.4  HCT 51.0  PLT 193   ------------------------------------------------------------------------------------------------------------------  Chemistries  Recent Labs  Lab 12/05/19 2131  NA 138  K 4.1  CL 100  CO2 29  GLUCOSE 148*  BUN 14  CREATININE 1.01  CALCIUM 8.6*   ------------------------------------------------------------------------------------------------------------------  Cardiac Enzymes No results for input(s): TROPONINI in the last 168 hours. ------------------------------------------------------------------------------------------------------------------  RADIOLOGY:  DG Chest 1 View  Result Date: 12/05/2019 CLINICAL DATA:  Weakness, short of breath, dizziness EXAM: CHEST  1 VIEW COMPARISON:  12/09/2017 FINDINGS: Single frontal view of the chest demonstrates a stable cardiac silhouette. Lung volumes are diminished with crowding of the central vasculature. No airspace disease, effusion, or pneumothorax. IMPRESSION: 1. Low lung volumes.  No acute process. Electronically Signed   By: Sharlet Salina M.D.   On: 12/05/2019 22:10   CT Angio Chest PE W and/or Wo Contrast  Result Date: 12/06/2019 CLINICAL DATA:  Weakness, shortness of breath and dizziness EXAM:  CT ANGIOGRAPHY CHEST WITH CONTRAST TECHNIQUE: Multidetector CT imaging of the chest was performed using the standard protocol during bolus administration of intravenous contrast. Initial imaging was nondiagnostic, repeat bolus administration was performed with again suboptimal timing. Multiplanar CT image reconstructions and MIPs were obtained to evaluate the vascular anatomy. CONTRAST:  OMNIPAQUE IOHEXOL 350 MG/ML SOLN COMPARISON:  Radiograph 12/05/2019 FINDINGS: Cardiovascular: Imaging quality is severely degraded due to patient body habitus resulting in marked central photopenia. Additionally, there is suboptimal contrast bolus timing despite multiple attempts at injection. Further degradation due to extensive respiratory motion artifact. Imaging evaluation of the pulmonary arteries and particular is essentially nondiagnostic. No large definite central filling defect and more peripheral filling defects are difficult to exclude. Central pulmonary arteries are normal caliber. Normal heart size. No pericardial effusion. The aorta is normal caliber. No gross abnormality of the aorta nor periaortic stranding or hemorrhage. Normal 3 vessel branching of the aortic arch. Proximal great vessels are unremarkable. Mediastinum/Nodes: No mediastinal fluid or gas. Normal thyroid gland and thoracic inlet. No acute abnormality of the trachea or esophagus. No worrisome mediastinal, hilar or axillary adenopathy. Lungs/Pleura: Evaluation of the lung parenchyma is severely limited due to extensive respiratory motion artifact. There is some atelectatic changes in the lungs likely accentuated by imaging during exhalation as evidenced by posterior bowing of the trachea which is typical for the angiographic technique. No consolidation, features of edema, pneumothorax, or effusion. Upper Abdomen: No acute abnormalities present in the visualized portions of the upper abdomen. Musculoskeletal: No chest wall abnormality. No acute  or  significant osseous findings. Review of the MIP images confirms the above findings. IMPRESSION: 1. Imaging quality is severely degraded due to patient body habitus, suboptimal contrast bolus timing, and extensive respiratory motion artifact. 2. Imaging of the pulmonary arteries is essentially nondiagnostic without large central filling defect and more peripheral defects are difficult to exclude. 3. Some atelectatic changes are present in the lungs without other acute intrathoracic abnormality. Electronically Signed   By: Kreg Shropshire M.D.   On: 12/06/2019 02:25      IMPRESSION AND PLAN:   1.  Cannabinoids overdose. -This could be contributing to the patient's generalized weakness and dizziness which could have led to respiratory depression with subsequent hypoxemia. -The patient will be monitored while he is here.  2.  Hypoxia at rest. -We will obtain a BNP and D-dimer. -The patient may need VQ scan as chest CTA was nondiagnostic. -Given lower extremity edema I obtain bilateral lower extremity venous duplex to rule out DVT. -We will place him on IV Lasix.  3.  Obstructive sleep apnea with noncompliance to CPAP. -She will be placed on CPAP while here. -This could be contributing to his hypoxia at rest especially that it is occurring at night.  4.  Uncontrolled hypertension. -We will continue Coreg and place him on as needed IV labetalol.  5.  DVT prophylaxis. -Subcutaneous heparin.  All the records are reviewed and case discussed with ED provider. The plan of care was discussed in details with the patient (and family). I answered all questions. The patient agreed to proceed with the above mentioned plan. Further management will depend upon hospital course.   CODE STATUS: Full code  Status is: Inpatient  Remains inpatient appropriate because:Ongoing diagnostic testing needed not appropriate for outpatient work up, Unsafe d/c plan, IV treatments appropriate due to intensity of  illness or inability to take PO and Inpatient level of care appropriate due to severity of illness   Dispo: The patient is from: Home              Anticipated d/c is to: Home              Anticipated d/c date is: 1 day              Patient currently is not medically stable to d/c.   TOTAL TIME TAKING CARE OF THIS PATIENT: 55 minutes.    Hannah Beat M.D on 12/06/2019 at 6:36 AM  [facility]   From 7 PM-7 AM, contact night-coverage [email or web address]  CC: Primary care physician; Patient, No Pcp Per   Note: This dictation was prepared with Dragon dictation along with smaller phrase technology. Any transcriptional errors that result from this process are unintentional.

## 2019-12-06 NOTE — Progress Notes (Signed)
No charge progress note.  Wyatt Mckinney  is a 45 y.o. male with a known history of morbid obesity and obstructive sleep apnea likely with chronic hypoxia, presented to the emergency room with acute onset of generalized weakness and dizziness after taking the gummy that is medicated with 600 mg with THC, with associated hypoxia to 86% while sitting.  The patient has history of obstructive sleep apnea and is supposed to be on CPAP but does not use it.  He was supposed to have a polysomnography study near future.  He denied any fever or chills.  Patient continued to drop oxygen saturation in high 70s to low 80s whenever sleeps.  He was placed on CPAP with improvement in his oxygenation whenever sleeping.  Per wife he was scheduled for a sleep study but he never followed up.  On exam morbidly obese gentleman, unable to and on pulmonary due to body habitus.  Had 2+ lower extremity edema.  Laced on IV Lasix by admitting provider.  Continue with current management which includes CPAP while sleeping.

## 2019-12-06 NOTE — ED Notes (Signed)
Previous Engineer, technical sales, gave report to 1A. 1A called prior to leaving the ED to inform them that the pt is on the way up/ delay in transporting pt to floor due to transport tech delay on day shift.

## 2019-12-06 NOTE — ED Notes (Signed)
Pt provided hospital bed, new linens and pillow

## 2019-12-06 NOTE — ED Notes (Signed)
Report to jessica, rn

## 2019-12-06 NOTE — ED Provider Notes (Signed)
[facility] Emergency Department Provider Note   ____________________________________________   I have reviewed the triage vital signs and the nursing notes.   HISTORY  Chief Complaint Weakness   History limited by: Not Limited   HPI Wyatt Mckinney is a 44 y.o. male who presents to the emergency department today because of concerns for weakness, lightheadedness that occurred roughly 1 and half hours after he ingested THC gummy.  He states this was his first time using THC.  He denies any chest pain or shortness of breath with this.  He states by the time my exam he is feeling better and back to normal.  However he was noted to have low oxygen levels in triage and when asked about this he states that his oxygen levels have been low for a long time.  He had one point been set up for a sleep study with his primary care because of concerns for sleep apnea although for various reasons he has not been able to complete one. He did have covid roughly 1 year ago and says he was sent home with oxygen for some time after that. He denies any fevers.   Records reviewed. Per medical record review patient has a history of COVID roughly 1 year ago.  History reviewed. No pertinent past medical history.  There are no problems to display for this patient.   History reviewed. No pertinent surgical history.  Prior to Admission medications   Medication Sig Start Date End Date Taking? Authorizing Provider  ibuprofen (ADVIL,MOTRIN) 200 MG tablet Take 400-600 mg by mouth every 6 (six) hours as needed for mild pain or moderate pain.    [provider]    Allergies Patient has no known allergies.  History reviewed. No pertinent family history.  Social History Social History   Tobacco Use  . Smoking status: Never Smoker  . Smokeless tobacco: Never Used  Substance Use Topics  . Alcohol use: No  . Drug use: No    Review of Systems Constitutional: No  fever/chills Eyes: No visual changes. ENT: No sore throat. Cardiovascular: Denies chest pain. Respiratory: Denies shortness of breath. Gastrointestinal: No abdominal pain.  No nausea, no vomiting.  No diarrhea.   Genitourinary: Negative for dysuria. Musculoskeletal: Negative for back pain. Skin: Negative for rash. Neurological: Positive for lightheadedness  ____________________________________________   PHYSICAL EXAM:  VITAL SIGNS: ED Triage Vitals  Enc Vitals Group     BP 12/05/19 2127 (!) 190/97     Pulse Rate 12/05/19 2124 96     Resp 12/05/19 2124 (!) 22     Temp 12/05/19 2124 99.3 F (37.4 C)     Temp Source 12/05/19 2124 Oral     SpO2 12/05/19 2124 (!) 88 %     Weight 12/05/19 2125 (!) 410 lb (186 kg)     Height 12/05/19 2125 5\' 10"  (1.778 m)     Head Circumference --      Peak Flow --      Pain Score 12/05/19 2125 0   Constitutional: Alert and oriented.  Eyes: Conjunctivae are normal.  ENT      Head: Normocephalic and atraumatic.      Nose: No congestion/rhinnorhea.      Mouth/Throat: Mucous membranes are moist.      Neck: No stridor. Hematological/Lymphatic/Immunilogical: No cervical lymphadenopathy. Cardiovascular: Normal rate, regular rhythm.  No murmurs, rubs, or gallops.  Respiratory: Normal respiratory effort without tachypnea nor retractions. Breath sounds are clear and equal bilaterally. No  wheezes/rales/rhonchi. Gastrointestinal: Soft and non tender. No rebound. No guarding.  Genitourinary: Deferred Musculoskeletal: Normal range of motion in all extremities. No lower extremity edema. Neurologic:  Normal speech and language. No gross focal neurologic deficits are appreciated.  Skin:  Skin is warm, dry and intact. No rash noted. Psychiatric: Mood and affect are normal. Speech and behavior are normal. Patient exhibits appropriate insight and judgment.  ____________________________________________    LABS (pertinent positives/negatives)  CBC wbc 7.6,  hgb 15.4, plt 193 BMP wnl except glu 148, ca 8.6  ____________________________________________   EKG  I, Phineas Semen, attending physician, personally viewed and interpreted this EKG  EKG Time: 2131 Rate: 90 Rhythm: normal sinus rhythm Axis: normal Intervals: qtc 418 QRS: narrow, q waves III, aVF ST changes: no st elevation Impression: abnormal ekg  ____________________________________________    RADIOLOGY  CXR No acute abnormality  CT angio Artifact rendering the exam essentially non diagnostic for PE ____________________________________________   PROCEDURES  Procedures  ____________________________________________   INITIAL IMPRESSION / ASSESSMENT AND PLAN / ED COURSE  Pertinent labs & imaging results that were available during my care of the patient were reviewed by me and considered in my medical decision making (see chart for details).   Patient presented to the emergency department today because of concerns for abnormal sensations and lightheadedness.  This did occur somewhat shortly after he had used a THC gummy.  He denies using these in the past.  I do think though symptoms likely were more related to his THC use.  However while the patient was here was also found to be hypoxic.  Even on my exam just sitting him up off of oxygen dropped his oxygen level to 88.  Patient states he has a history of hypoxia.  CT angio was performed although secondary to body habitus and artifact it was essentially nondiagnostic.  Given hypoxia will plan on admission. ____________________________________________   FINAL CLINICAL IMPRESSION(S) / ED DIAGNOSES  Hypoxia  Note: This dictation was prepared with Dragon dictation. Any transcriptional errors that result from this process are unintentional     Phineas Semen, MD 12/06/19 [phone]

## 2019-12-06 NOTE — ED Notes (Signed)
Ultrasound in progress  

## 2019-12-06 NOTE — ED Notes (Signed)
Pt remains in ct 

## 2019-12-06 NOTE — ED Notes (Signed)
Attempted to call report to 1A, per secretary she is unsure who is getting the patient or what room the patient is going to. She states that the nurse will call me back in 5-10 mins.

## 2019-12-06 NOTE — ED Notes (Signed)
Pt's wife brought pt breakfast from [employer]

## 2019-12-06 NOTE — ED Notes (Signed)
Dr. Nelson Chimes informed of patient's O2 which continues to drop into the high 70s and low 80s when sleeping despite being on 3L [place] and sitting up in bed. Per Dr. Nelson Chimes, place pt on cpap when sleeping. Respiratory therapist Amy informed.

## 2019-12-07 LAB — CBC
HCT: 47.6 % (ref 39.0–52.0)
Hemoglobin: 14 g/dL (ref 13.0–17.0)
MCH: 27.9 pg (ref 26.0–34.0)
MCHC: 29.4 g/dL — ABNORMAL LOW (ref 30.0–36.0)
MCV: 95 fL (ref 80.0–100.0)
Platelets: 198 10*3/uL (ref 150–400)
RBC: 5.01 MIL/uL (ref 4.22–5.81)
RDW: 15.2 % (ref 11.5–15.5)
WBC: 7.5 10*3/uL (ref 4.0–10.5)
nRBC: 0 % (ref 0.0–0.2)

## 2019-12-07 LAB — BASIC METABOLIC PANEL
Anion gap: 7 (ref 5–15)
BUN: 13 mg/dL (ref 6–20)
CO2: 35 mmol/L — ABNORMAL HIGH (ref 22–32)
Calcium: 8.4 mg/dL — ABNORMAL LOW (ref 8.9–10.3)
Chloride: 98 mmol/L (ref 98–111)
Creatinine, Ser: 0.92 mg/dL (ref 0.61–1.24)
GFR calc Af Amer: >60 mL/min (ref >60)
GFR calc non Af Amer: >60 mL/min (ref >60)
Glucose, Bld: 172 mg/dL — ABNORMAL HIGH (ref 70–99)
Potassium: 4.3 mmol/L (ref 3.5–5.1)
Sodium: 140 mmol/L (ref 135–145)

## 2019-12-07 LAB — HIV ANTIBODY (ROUTINE TESTING W REFLEX): HIV Screen 4th Generation wRfx: NONREACTIVE

## 2019-12-07 NOTE — Progress Notes (Signed)
Patient is stable and ready for discharge. Writer in with Dr. Fran Lowes to monitor patient's O2 to watch for desat however patient maintained O2 on room air 89-95%. Patient's wife at bedside at this time and has scheduled patient a sleep study to get CPAP. Writer removed all IVs. Writer went over discharge paperwork with patient and he verbalized understanding. Patient's belongings packed and taken with wife. Patient was transported by nurse tech to patient's car.

## 2019-12-07 NOTE — Plan of Care (Signed)

## 2019-12-07 NOTE — Discharge Summary (Signed)
Physician Discharge Summary   Wyatt Mckinney  male DOB: [birth year]  XQJ:[id number]  PCP: Patient, No Pcp Per  Admit date: 12/06/2019 Discharge date: 12/07/2019  Admitted From: home Disposition:  home CODE STATUS: Full code  Discharge Instructions    Discharge instructions   Complete by: As directed    As we discussed, your lungs have difficulty expending to fill with air because your abdomen is pushing up against them.  Weight loss will help.  Also you need to get the sleep study and the CPAP to make sure you get enough oxygen while you are sleeping.   Dr. Darlin Priestly [facility] Course:  For full details, please see H&P, progress notes, consult notes and ancillary notes.  Briefly,  Milt Coye  is a 44 y.o. AA male with a known history of morbid obesity and obstructive sleep apnea likely with chronic hypoxia, presented to the emergency room with acute onset of generalized weakness and dizziness after taking the gummy that is medicated with 600 mg with THC, with associated hypoxia to 86% while sitting.  The patient has history of obstructive sleep apnea and is supposed to be on CPAP but does not use it.    1.  Cannabinoids overdose. This could be contributing to the patient's generalized weakness and dizziness on presentation, which could have led to respiratory depression with subsequent hypoxemia.  Pt felt back to normal the next day and was discharged.  2.  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  Pt encouraged to loose weight and practice take in deeper breaths.    3.  Obstructive sleep apnea  Pt and wife agreed to make sure pt gets a outpatient sleep study and gets setup with CPAP.  4.  hypertension. continued Coreg    Discharge Diagnoses:  Active Problems:   Hypoxia    Discharge Instructions:  Allergies as of 12/07/2019   No Known Allergies     Medication List      TAKE these medications   carvedilol 3.125 MG tablet Commonly known as: COREG Take by mouth.   ibuprofen 200 MG tablet Commonly known as: ADVIL Take 400-600 mg by mouth every 6 (six) hours as needed for mild pain or moderate pain.       Follow-up Information    sleep study. Schedule an appointment as soon as possible for a visit in 1 day(s).           No Known Allergies   The results of significant diagnostics from this hospitalization (including imaging, microbiology, ancillary and laboratory) are listed below for reference.   Consultations:   Procedures/Studies: DG Chest 1 View  Result Date: 12/05/2019 CLINICAL DATA:  Weakness, short of breath, dizziness EXAM: CHEST  1 VIEW COMPARISON:  12/09/2017 FINDINGS: Single frontal view of the chest demonstrates a stable cardiac silhouette. Lung volumes are diminished with crowding of the central vasculature. No airspace disease, effusion, or pneumothorax. IMPRESSION: 1. Low lung volumes.  No acute process. Electronically Signed   By: Sharlet Salina M.D.   On: 12/05/2019 22:10   CT Angio Chest PE W and/or Wo Contrast  Result Date: 12/06/2019 CLINICAL DATA:  Weakness, shortness of breath and dizziness EXAM: CT ANGIOGRAPHY CHEST WITH CONTRAST TECHNIQUE: Multidetector CT imaging of the chest was performed using the standard protocol during bolus administration of intravenous contrast. Initial imaging was nondiagnostic, repeat bolus administration  was performed with again suboptimal timing. Multiplanar CT image reconstructions and MIPs were obtained to evaluate the vascular anatomy. CONTRAST:  OMNIPAQUE IOHEXOL 350 MG/ML SOLN COMPARISON:  Radiograph 12/05/2019 FINDINGS: Cardiovascular: Imaging quality is severely degraded due to patient body habitus resulting in marked central photopenia. Additionally, there is suboptimal contrast bolus timing despite multiple attempts at injection. Further degradation due to extensive respiratory motion  artifact. Imaging evaluation of the pulmonary arteries and particular is essentially nondiagnostic. No large definite central filling defect and more peripheral filling defects are difficult to exclude. Central pulmonary arteries are normal caliber. Normal heart size. No pericardial effusion. The aorta is normal caliber. No gross abnormality of the aorta nor periaortic stranding or hemorrhage. Normal 3 vessel branching of the aortic arch. Proximal great vessels are unremarkable. Mediastinum/Nodes: No mediastinal fluid or gas. Normal thyroid gland and thoracic inlet. No acute abnormality of the trachea or esophagus. No worrisome mediastinal, hilar or axillary adenopathy. Lungs/Pleura: Evaluation of the lung parenchyma is severely limited due to extensive respiratory motion artifact. There is some atelectatic changes in the lungs likely accentuated by imaging during exhalation as evidenced by posterior bowing of the trachea which is typical for the angiographic technique. No consolidation, features of edema, pneumothorax, or effusion. Upper Abdomen: No acute abnormalities present in the visualized portions of the upper abdomen. Musculoskeletal: No chest wall abnormality. No acute or significant osseous findings. Review of the MIP images confirms the above findings. IMPRESSION: 1. Imaging quality is severely degraded due to patient body habitus, suboptimal contrast bolus timing, and extensive respiratory motion artifact. 2. Imaging of the pulmonary arteries is essentially nondiagnostic without large central filling defect and more peripheral defects are difficult to exclude. 3. Some atelectatic changes are present in the lungs without other acute intrathoracic abnormality. Electronically Signed   By: Kreg Shropshire M.D.   On: 12/06/2019 02:25   US Venous Img Lower Bilateral (DVT)  Result Date: 12/06/2019 CLINICAL DATA:  44 year old with lower extremity edema. EXAM: BILATERAL LOWER EXTREMITY VENOUS DOPPLER ULTRASOUND  TECHNIQUE: Gray-scale sonography with graded compression, as well as color Doppler and duplex ultrasound were performed to evaluate the lower extremity deep venous systems from the level of the common femoral vein and including the common femoral, femoral, profunda femoral, popliteal and calf veins including the posterior tibial, peroneal and gastrocnemius veins when visible. The superficial great saphenous vein was also interrogated. Spectral Doppler was utilized to evaluate flow at rest and with distal augmentation maneuvers in the common femoral, femoral and popliteal veins. COMPARISON:  None. FINDINGS: RIGHT LOWER EXTREMITY Common Femoral Vein: No evidence of thrombus. Normal compressibility, respiratory phasicity and response to augmentation. Saphenofemoral Junction: No evidence of thrombus. Normal compressibility and flow on color Doppler imaging. Profunda Femoral Vein: No evidence of thrombus. Normal compressibility and flow on color Doppler imaging. Femoral Vein: No evidence of thrombus. Normal compressibility, respiratory phasicity and response to augmentation. Limited evaluation of the femoral veins due to body habitus. Popliteal Vein: No evidence of thrombus. Normal compressibility, respiratory phasicity and response to augmentation. Calf Veins: Limited evaluation. Other Findings:  None. LEFT LOWER EXTREMITY Common Femoral Vein: No evidence of thrombus. Normal compressibility, respiratory phasicity and response to augmentation. Saphenofemoral Junction: No evidence of thrombus. Normal compressibility and flow on color Doppler imaging. Profunda Femoral Vein: No evidence of thrombus. Normal compressibility and flow on color Doppler imaging. Femoral Vein: No evidence of thrombus. Normal compressibility, respiratory phasicity and response to augmentation. Limited evaluation of the femoral veins due to body habitus.  Popliteal Vein: No evidence of thrombus. Normal compressibility, respiratory phasicity and  response to augmentation. Calf Veins: Visualized left deep calf veins are patent without thrombus. Other Findings:  None. IMPRESSION: No extensive deep venous thrombosis in the lower extremities. Study has significant limitations due to body habitus. Markedly limited evaluation of the deep calf veins. Electronically Signed   By: Markus Daft M.D.   On: 12/06/2019 08:02      Labs: BNP (last 3 results) Recent Labs    12/06/19 2033  BNP 82.9   Basic Metabolic Panel: Recent Labs  Lab 12/05/19 2131 12/07/19 0458  NA 138 140  K 4.1 4.3  CL 100 98  CO2 29 35*  GLUCOSE 148* 172*  BUN 14 13  CREATININE 1.01 0.92  CALCIUM 8.6* 8.4*   Liver Function Tests: No results for input(s): AST, ALT, ALKPHOS, BILITOT, PROT, ALBUMIN in the last 168 hours. No results for input(s): LIPASE, AMYLASE in the last 168 hours. No results for input(s): AMMONIA in the last 168 hours. CBC: Recent Labs  Lab 12/05/19 2131 12/07/19 0458  WBC 7.6 7.5  HGB 15.4 14.0  HCT 51.0 47.6  MCV 92.6 95.0  PLT 193 198   Cardiac Enzymes: No results for input(s): CKTOTAL, CKMB, CKMBINDEX, TROPONINI in the last 168 hours. BNP: Invalid input(s): POCBNP CBG: No results for input(s): GLUCAP in the last 168 hours. D-Dimer No results for input(s): DDIMER in the last 72 hours. Hgb A1c No results for input(s): HGBA1C in the last 72 hours. Lipid Profile No results for input(s): CHOL, HDL, LDLCALC, TRIG, CHOLHDL, LDLDIRECT in the last 72 hours. Thyroid function studies No results for input(s): TSH, T4TOTAL, T3FREE, THYROIDAB in the last 72 hours.  Invalid input(s): FREET3 Anemia work up No results for input(s): VITAMINB12, FOLATE, FERRITIN, TIBC, IRON, RETICCTPCT in the last 72 hours. Urinalysis    Component Value Date/Time   COLORURINE YELLOW (A) 12/05/2019 0400   APPEARANCEUR CLEAR (A) 12/05/2019 0400   APPEARANCEUR Clear 10/22/2014 0614   LABSPEC >1.046 (H) 12/05/2019 0400   LABSPEC 1.018 10/22/2014 0614    PHURINE 6.0 12/05/2019 0400   GLUCOSEU NEGATIVE 12/05/2019 0400   GLUCOSEU Negative 10/22/2014 0614   HGBUR NEGATIVE 12/05/2019 0400   BILIRUBINUR NEGATIVE 12/05/2019 0400   BILIRUBINUR Negative 10/22/2014 0614   KETONESUR NEGATIVE 12/05/2019 0400   PROTEINUR NEGATIVE 12/05/2019 0400   UROBILINOGEN 0.2 06/23/2014 0905   NITRITE NEGATIVE 12/05/2019 0400   LEUKOCYTESUR NEGATIVE 12/05/2019 0400   LEUKOCYTESUR Negative 10/22/2014 0614   Sepsis Labs Invalid input(s): PROCALCITONIN,  WBC,  LACTICIDVEN Microbiology Recent Results (from the past 240 hour(s))  SARS Coronavirus 2 by RT PCR (hospital order, performed in [place] hospital lab) Nasopharyngeal Nasopharyngeal Swab     Status: None   Collection Time: 12/06/19  1:06 AM   Specimen: Nasopharyngeal Swab  Result Value Ref Range Status   SARS Coronavirus 2 NEGATIVE NEGATIVE Final    Comment: (NOTE) SARS-CoV-2 target nucleic acids are NOT DETECTED. The SARS-CoV-2 RNA is generally detectable in upper and lower respiratory specimens during the acute phase of infection. The lowest concentration of SARS-CoV-2 viral copies this assay can detect is 250 copies / mL. A negative result does not preclude SARS-CoV-2 infection and should not be used as the sole basis for treatment or other patient management decisions.  A negative result may occur with improper specimen collection / handling, submission of specimen other than nasopharyngeal swab, presence of viral mutation(s) within the areas targeted by this assay, and inadequate  number of viral copies (<250 copies / mL). A negative result must be combined with clinical observations, patient history, and epidemiological information. Fact Sheet for Patients:   BoilerBrush.com.cy Fact Sheet for Healthcare Providers: https://pope.com/ This test is not yet approved or cleared  by the [place] FDA and has been authorized for detection and/or  diagnosis of SARS-CoV-2 by FDA under an Emergency Use Authorization (EUA).  This EUA will remain in effect (meaning this test can be used) for the duration of the COVID-19 declaration under Section 564(b)(1) of the Act, 21 U.S.C. section 360bbb-3(b)(1), unless the authorization is terminated or revoked sooner. Performed at [facility], [address] [place], [place] [address]      Total time spend on discharging this patient, including the last patient exam, discussing the hospital stay, instructions for ongoing care as it relates to all pertinent caregivers, as well as preparing the medical discharge records, prescriptions, and/or referrals as applicable, is 45 minutes.    Darlin Priestly, MD  [facility] 12/07/2019, 5:29 PM  If 7PM-7AM, please contact night-coverage

## 2020-07-21 ENCOUNTER — Ambulatory Visit: Payer: Self-pay | Admitting: Cardiology

## 2020-07-24 ENCOUNTER — Encounter: Payer: Self-pay | Admitting: Cardiology

## 2020-07-27 NOTE — Progress Notes (Deleted)
   New Outpatient Visit Date: 07/28/2020  Referring Provider: Gorden Harms, PA-C [address] [place],  [place] [address]  Chief Complaint: ***  HPI:  Wyatt Mckinney is a 45 y.o. male who is being seen today for the evaluation of *** at the request of Gorden Harms, [place]. He has a history of ***. ***  --------------------------------------------------------------------------------------------------  Cardiovascular History & Procedures: Cardiovascular Problems:  ***  Risk Factors:  ***  Cath/PCI:  ***  CV Surgery:  ***  EP Procedures and Devices:  ***  Non-Invasive Evaluation(s):  ***  Recent CV Pertinent Labs: Lab Results  Component Value Date   INR 0.99 05/09/2015   BNP 14.7 12/06/2019   K 4.3 12/07/2019   K 4.2 10/22/2014   MG 2.1 05/09/2015   BUN 13 12/07/2019   BUN 12 10/22/2014   CREATININE 0.92 12/07/2019   CREATININE 0.92 10/22/2014    --------------------------------------------------------------------------------------------------  No past medical history on file.  No past surgical history on file.  No outpatient medications have been marked as taking for the 07/28/20 encounter (Appointment) with Adonica Fukushima, Cristal Deer, MD.    Allergies: Patient has no known allergies.  Social History   Tobacco Use  . Smoking status: Never Smoker  . Smokeless tobacco: Never Used  Substance Use Topics  . Alcohol use: No  . Drug use: No    No family history on file.  Review of Systems: A 12-system review of systems was performed and was negative except as noted in the HPI.  --------------------------------------------------------------------------------------------------  Physical Exam: There were no vitals taken for this visit.  General:  *** HEENT: No conjunctival pallor or scleral icterus. Facemask in place. Neck: Supple without lymphadenopathy, thyromegaly, JVD, or HJR. No carotid bruit. Lungs: Normal work of breathing. Clear to auscultation  bilaterally without wheezes or crackles. Heart: Regular rate and rhythm without murmurs, rubs, or gallops. Non-displaced PMI. Abd: Bowel sounds present. Soft, NT/ND without hepatosplenomegaly Ext: No lower extremity edema. Radial, PT, and DP pulses are 2+ bilaterally Skin: Warm and dry without rash. Neuro: CNIII-XII intact. Strength and fine-touch sensation intact in upper and lower extremities bilaterally. Psych: Normal mood and affect.  EKG:  ***  Lab Results  Component Value Date   WBC 7.5 12/07/2019   HGB 14.0 12/07/2019   HCT 47.6 12/07/2019   MCV 95.0 12/07/2019   PLT 198 12/07/2019    Lab Results  Component Value Date   NA 140 12/07/2019   K 4.3 12/07/2019   CL 98 12/07/2019   CO2 35 (H) 12/07/2019   BUN 13 12/07/2019   CREATININE 0.92 12/07/2019   GLUCOSE 172 (H) 12/07/2019   ALT 22 05/09/2015    No results found for: CHOL, HDL, LDLCALC, LDLDIRECT, TRIG, CHOLHDL   --------------------------------------------------------------------------------------------------  ASSESSMENT AND PLAN: ***  Yvonne Kendall, MD 07/27/2020 2:32 PM

## 2020-07-28 ENCOUNTER — Ambulatory Visit: Payer: BLUE CROSS/BLUE SHIELD | Admitting: Internal Medicine

## 2020-08-04 ENCOUNTER — Ambulatory Visit: Payer: Self-pay | Admitting: Cardiology

## 2020-08-07 ENCOUNTER — Encounter: Payer: Self-pay | Admitting: Cardiology

## 2020-09-22 ENCOUNTER — Encounter: Payer: Self-pay | Admitting: Cardiology

## 2020-09-22 ENCOUNTER — Other Ambulatory Visit: Payer: Self-pay

## 2020-09-22 ENCOUNTER — Ambulatory Visit: Payer: BLUE CROSS/BLUE SHIELD | Admitting: Cardiology

## 2020-09-22 VITALS — BP 134/84 | HR 90 | Ht 68.5 in | Wt >= 6400 oz

## 2020-09-22 DIAGNOSIS — R600 Localized edema: Secondary | ICD-10-CM

## 2020-09-22 DIAGNOSIS — I10 Essential (primary) hypertension: Secondary | ICD-10-CM

## 2020-09-22 DIAGNOSIS — E7800 Pure hypercholesterolemia, unspecified: Secondary | ICD-10-CM

## 2020-09-22 MED ORDER — TORSEMIDE 40 MG PO TABS: 40.0000 mg | ORAL_TABLET | Freq: Every day | ORAL | 5 refills | Status: DC

## 2020-09-22 MED ORDER — TORSEMIDE 40 MG PO TABS: 40.0000 mg | ORAL_TABLET | Freq: Every day | ORAL | 5 refills | Status: AC

## 2020-09-22 NOTE — Progress Notes (Signed)
Cardiology Office Note:    Date:  09/22/2020   ID:  Wyatt Mckinney, DOB 1975/10/15, MRN [id number]  PCP:  Patient, No Pcp Per   [facility]  Cardiologist:  Debbe Odea, MD  Advanced Practice Provider:  No care team member to display Electrophysiologist:  None       Referring MD: Gorden Harms, PA-C   Chief Complaint  Patient presents with   New Patient (Initial Visit)    Referred by PCP. Patient c.o fluid build-up in in legs and chest pain. Meds reviewed verbally with patient.    Wyatt Mckinney is a 45 y.o. male who is being seen today for the evaluation of lower extremity edema at the request of Gorden Harms, [place].   History of Present Illness:    Wyatt Mckinney is a 46 y.o. male with a hx of hypertension, hyperlipidemia, OSA on CPAP, morbid obesity who presents due to lower extremity edema.  Patient states having lower extremity edema for about 2 years now.  Edema usually worse since it was elevated.  Overall, symptoms have stayed about the same.  Recently started on Lasix with some improvement.  He is compliant with his CPAP mask.  States having shortness of breath when he overexerts himself.  Denies chest pain, palpitations, history of heart disease.  Past Medical History:  Diagnosis Date   Hyperlipidemia    Hypertension    Obesity    OSA (obstructive sleep apnea)     History reviewed. No pertinent surgical history.  Current Medications: Current Meds  Medication Sig   atorvastatin (LIPITOR) 40 MG tablet Take 40 mg by mouth daily.   carvedilol (COREG) 3.125 MG tablet Take by mouth.   ibuprofen (ADVIL,MOTRIN) 200 MG tablet Take 400-600 mg by mouth every 6 (six) hours as needed for mild pain or moderate pain.   [DISCONTINUED] furosemide (LASIX) 40 MG tablet Take 40 mg by mouth daily.   [DISCONTINUED] Torsemide 40 MG TABS Take 40 mg by mouth daily.     Allergies:   Patient has no known allergies.   Social History    Socioeconomic History   Marital status: Married    Spouse name: Not on file   Number of children: Not on file   Years of education: Not on file   Highest education level: Not on file  Occupational History   Not on file  Tobacco Use   Smoking status: Never Smoker   Smokeless tobacco: Never Used  Substance and Sexual Activity   Alcohol use: No   Drug use: No   Sexual activity: Not Currently  Other Topics Concern   Not on file  Social History Narrative   Not on file   Social Determinants of Health   Financial Resource Strain: Not on file  Food Insecurity: Not on file  Transportation Needs: Not on file  Physical Activity: Not on file  Stress: Not on file  Social Connections: Not on file     Family History: The patient's family history is not on file.  ROS:   Please see the history of present illness.     All other systems reviewed and are negative.  EKGs/Labs/Other Studies Reviewed:    The following studies were reviewed today:   EKG:  EKG is  ordered today.  The ekg ordered today demonstrates normal sinus rhythm, cannot rule out anterior lateral infarct  Recent Labs: 12/06/2019: B Natriuretic Peptide 14.7 12/07/2019: BUN 13; Creatinine, Ser 0.92; Hemoglobin 14.0; Platelets 198; Potassium  4.3; Sodium 140  Recent Lipid Panel No results found for: CHOL, TRIG, HDL, CHOLHDL, VLDL, LDLCALC, LDLDIRECT   Risk Assessment/Calculations:      Physical Exam:    VS:  BP 134/84 (BP Location: Left Arm, Patient Position: Sitting, Cuff Size: Normal)    Pulse 90    Ht 5' 8.5" (1.74 m)    Wt (!) 481 lb (218.2 kg)    BMI 72.07 kg/m     Wt Readings from Last 3 Encounters:  09/22/20 (!) 481 lb (218.2 kg)  12/06/19 (!) 492 lb 8 oz (223.4 kg)  12/09/17 (!) 389 lb (176.4 kg)     GEN:  Well nourished, well developed in no acute distress HEENT: Normal NECK: No JVD; No carotid bruits LYMPHATICS: No lymphadenopathy CARDIAC: RRR, no murmurs, rubs, gallops RESPIRATORY:   Clear to auscultation without rales, wheezing or rhonchi  ABDOMEN: Soft, non-tender, abdomen is severely distended MUSCULOSKELETAL:  2+ edema; No deformity  SKIN: Warm and dry NEUROLOGIC:  Alert and oriented x 3 PSYCHIATRIC:  Normal affect   ASSESSMENT:    1. Bilateral leg edema   2. Primary hypertension   3. Pure hypercholesterolemia   4. Morbid obesity (HCC)    PLAN:    In order of problems listed above:  1. Bilateral lower extremity edema, this could be secondary to diastolic dysfunction or morbid obesity.  Obtain echocardiogram to evaluate systolic and diastolic function.  Stop Lasix, start torsemide 40 mg daily for better absorption. 2. Hypertension, BP controlled.  Continue Coreg. 3. Hyperlipidemia, continue Lipitor. 4. Morbid obesity, weight loss recommended.  Follow-up after echocardiogram.    Medication Adjustments/Labs and Tests Ordered: Current medicines are reviewed at length with the patient today.  Concerns regarding medicines are outlined above.  Orders Placed This Encounter  Procedures   EKG 12-Lead   ECHOCARDIOGRAM COMPLETE   Meds ordered this encounter  Medications   DISCONTD: Torsemide 40 MG TABS    Sig: Take 40 mg by mouth daily.    Dispense:  30 tablet    Refill:  5   Torsemide 40 MG TABS    Sig: Take 40 mg by mouth daily.    Dispense:  30 tablet    Refill:  5    Patient Instructions  Medication Instructions:   Your physician has recommended you make the following change in your medication:   1.  STOP taking your Lasix.  2.  START taking Torsemide 40 MG daily.  *If you need a refill on your cardiac medications before your next appointment, please call your pharmacy*   Lab Work: None ordered     Testing/Procedures:  1.  Your physician has requested that you have an echocardiogram. Echocardiography is a painless test that uses sound waves to create images of your heart. It provides your doctor with information about the size and  shape of your heart and how well your hearts chambers and valves are working. This procedure takes approximately one hour. There are no restrictions for this procedure.     Follow-Up: At [facility], you and your health needs are our priority.  As part of our continuing mission to provide you with exceptional heart care, we have created designated Provider Care Teams.  These Care Teams include your primary Cardiologist (physician) and Advanced Practice Providers (APPs -  Physician Assistants and Nurse Practitioners) who all work together to provide you with the care you need, when you need it.  We recommend signing up for the patient portal called "MyChart".  Sign up information is provided on this After Visit Summary.  MyChart is used to connect with patients for Virtual Visits (Telemedicine).  Patients are able to view lab/test results, encounter notes, upcoming appointments, etc.  Non-urgent messages can be sent to your provider as well.   To learn more about what you can do with MyChart, go to [email or web address].    Your next appointment:   Follow up after Echo   The format for your next appointment:   In Person  Provider:   Debbe Odea, MD   Other Instructions      Signed, Debbe Odea, MD  09/22/2020 12:44 PM    [facility]

## 2020-09-22 NOTE — Patient Instructions (Signed)
Medication Instructions:   Your physician has recommended you make the following change in your medication:   1.  STOP taking your Lasix.  2.  START taking Torsemide 40 MG daily.  *If you need a refill on your cardiac medications before your next appointment, please call your pharmacy*   Lab Work: None ordered     Testing/Procedures:  1.  Your physician has requested that you have an echocardiogram. Echocardiography is a painless test that uses sound waves to create images of your heart. It provides your doctor with information about the size and shape of your heart and how well your heart's chambers and valves are working. This procedure takes approximately one hour. There are no restrictions for this procedure.     Follow-Up: At [facility], you and your health needs are our priority.  As part of our continuing mission to provide you with exceptional heart care, we have created designated Provider Care Teams.  These Care Teams include your primary Cardiologist (physician) and Advanced Practice Providers (APPs -  Physician Assistants and Nurse Practitioners) who all work together to provide you with the care you need, when you need it.  We recommend signing up for the patient portal called "MyChart".  Sign up information is provided on this After Visit Summary.  MyChart is used to connect with patients for Virtual Visits (Telemedicine).  Patients are able to view lab/test results, encounter notes, upcoming appointments, etc.  Non-urgent messages can be sent to your provider as well.   To learn more about what you can do with MyChart, go to [email or web address].    Your next appointment:   Follow up after Echo   The format for your next appointment:   In Person  Provider:   Debbe Odea, MD   Other Instructions

## 2020-10-02 ENCOUNTER — Ambulatory Visit: Admission: RE | Admit: 2020-10-02 | Payer: BLUE CROSS/BLUE SHIELD | Source: Ambulatory Visit

## 2020-10-06 ENCOUNTER — Telehealth: Payer: Self-pay

## 2020-10-06 NOTE — Telephone Encounter (Signed)
Unable to speak with patient to schedule, left message with male to have him call our office.

## 2020-10-06 NOTE — Telephone Encounter (Signed)
-----   Message from Wyatt Mckinney sent at 10/05/2020  5:08 PM EDT ----- Regarding: echo Patient scheduled to F/U with Dr. Azucena Cecil on 10/16/2020 but did not show for echo. Can you please reschedule? Thank you!

## 2020-10-09 NOTE — Telephone Encounter (Signed)
Lm to call office

## 2020-10-16 ENCOUNTER — Ambulatory Visit: Payer: BLUE CROSS/BLUE SHIELD | Admitting: Cardiology

## 2022-02-04 DIAGNOSIS — E119 Type 2 diabetes mellitus without complications: Secondary | ICD-10-CM | POA: Diagnosis not present

## 2022-02-04 DIAGNOSIS — Z712 Person consulting for explanation of examination or test findings: Secondary | ICD-10-CM | POA: Diagnosis not present

## 2022-02-04 DIAGNOSIS — Z0130 Encounter for examination of blood pressure without abnormal findings: Secondary | ICD-10-CM | POA: Diagnosis not present

## 2022-02-04 DIAGNOSIS — I10 Essential (primary) hypertension: Secondary | ICD-10-CM | POA: Diagnosis not present

## 2022-02-04 DIAGNOSIS — I2510 Atherosclerotic heart disease of native coronary artery without angina pectoris: Secondary | ICD-10-CM | POA: Diagnosis not present

## 2022-02-04 DIAGNOSIS — Z0000 Encounter for general adult medical examination without abnormal findings: Secondary | ICD-10-CM | POA: Diagnosis not present

## 2022-02-04 DIAGNOSIS — Z1389 Encounter for screening for other disorder: Secondary | ICD-10-CM | POA: Diagnosis not present

## 2022-02-04 DIAGNOSIS — R0902 Hypoxemia: Secondary | ICD-10-CM | POA: Diagnosis not present

## 2022-02-04 DIAGNOSIS — R7309 Other abnormal glucose: Secondary | ICD-10-CM | POA: Diagnosis not present

## 2022-02-06 ENCOUNTER — Telehealth: Payer: Self-pay | Admitting: Cardiology

## 2022-02-06 DIAGNOSIS — R600 Localized edema: Secondary | ICD-10-CM

## 2022-02-06 NOTE — Telephone Encounter (Signed)
Received notes from Gorden Harms at [facility] Would like patient to have ECHO and follow up Please review and order ECHO

## 2022-02-07 ENCOUNTER — Encounter: Payer: Self-pay | Admitting: Physician Assistant

## 2022-02-11 ENCOUNTER — Other Ambulatory Visit: Payer: Self-pay

## 2022-02-11 ENCOUNTER — Telehealth: Payer: Self-pay

## 2022-02-11 DIAGNOSIS — Z1211 Encounter for screening for malignant neoplasm of colon: Secondary | ICD-10-CM

## 2022-02-11 MED ORDER — NA SULFATE-K SULFATE-MG SULF 17.5-3.13-1.6 GM/177ML PO SOLN: 1.0000 | Freq: Once | ORAL | 0 refills | Status: AC

## 2022-02-11 NOTE — Telephone Encounter (Signed)
Gastroenterology Pre-Procedure Review  Request Date: 03/14/22 Requesting Physician: Dr. Tobi Bastos  PATIENT REVIEW QUESTIONS: The patient responded to the following health history questions as indicated:    1. Are you having any GI issues? no 2. Do you have a personal history of Polyps? no 3. Do you have a family history of Colon Cancer or Polyps? no 4. Diabetes Mellitus? no 5. Joint replacements in the past 12 months?no 6. Major health problems in the past 3 months?no 7. Any artificial heart valves, MVP, or defibrillator?no    MEDICATIONS & ALLERGIES:    Patient reports the following regarding taking any anticoagulation/antiplatelet therapy:   Plavix, Coumadin, Eliquis, Xarelto, Lovenox, Pradaxa, Brilinta, or Effient? no Aspirin? no  Patient confirms/reports the following medications:  Current Outpatient Medications  Medication Sig Dispense Refill   atorvastatin (LIPITOR) 40 MG tablet Take 40 mg by mouth daily.     carvedilol (COREG) 3.125 MG tablet Take by mouth.     ibuprofen (ADVIL,MOTRIN) 200 MG tablet Take 400-600 mg by mouth every 6 (six) hours as needed for mild pain or moderate pain.     Torsemide 40 MG TABS Take 40 mg by mouth daily. 30 tablet 5   No current facility-administered medications for this visit.    Patient confirms/reports the following allergies:  No Known Allergies  No orders of the defined types were placed in this encounter.   AUTHORIZATION INFORMATION Primary Insurance: 1D#: Group #:  Secondary Insurance: 1D#: Group #:  SCHEDULE INFORMATION: Date: 03/14/22 Time: Location: [facility]

## 2022-02-14 NOTE — Telephone Encounter (Signed)
Order entered for Echo. Will send to [facility] to schedule Echo and follow up as requested.  Debbe Odea, MD  You 19 hours ago (6:27 PM)    Okay to order echo and schedule follow-up appointment as per my previous note.  Diagnosis bilateral leg edema

## 2022-02-14 NOTE — Telephone Encounter (Signed)
Called to schedule ECHO and follow up NO VM

## 2022-02-26 NOTE — Telephone Encounter (Signed)
Patient already scheduled 09/20 and 10/13

## 2022-03-14 ENCOUNTER — Ambulatory Visit: Admission: RE | Admit: 2022-03-14 | Payer: 59 | Source: Home / Self Care | Admitting: Gastroenterology

## 2022-03-14 ENCOUNTER — Encounter: Admission: RE | Payer: Self-pay | Source: Home / Self Care

## 2022-03-14 SURGERY — COLONOSCOPY WITH PROPOFOL
Anesthesia: General

## 2022-03-20 ENCOUNTER — Ambulatory Visit: Payer: 59

## 2022-04-12 ENCOUNTER — Ambulatory Visit: Payer: BLUE CROSS/BLUE SHIELD | Admitting: Cardiology

## 2022-04-24 ENCOUNTER — Ambulatory Visit: Payer: 59 | Attending: Cardiology

## 2022-05-03 ENCOUNTER — Ambulatory Visit: Payer: 59 | Attending: Cardiology | Admitting: Cardiology

## 2022-05-07 ENCOUNTER — Encounter: Payer: Self-pay | Admitting: Cardiology

## 2022-11-04 ENCOUNTER — Ambulatory Visit: Payer: 59

## 2022-11-05 ENCOUNTER — Ambulatory Visit: Payer: 59 | Admitting: Cardiology

## 2022-12-05 ENCOUNTER — Ambulatory Visit: Payer: 59 | Attending: Cardiology

## 2022-12-11 ENCOUNTER — Ambulatory Visit: Payer: 59 | Attending: Cardiology | Admitting: Cardiology

## 2022-12-11 NOTE — Progress Notes (Deleted)
  Cardiology Office Note:  .   Date:  12/11/2022  ID:  Wyatt Mckinney, DOB Oct 30, 1975, MRN [id number] PCP: Patient, No Pcp Per  [facility] Providers Cardiologist:  Debbe Odea, MD { Click to update primary MD,subspecialty MD or APP then REFRESH:1}   History of Present Illness: Marland Kitchen   Wyatt Mckinney is a 47 y.o. male with a past medical history of hypertension, hyperlipidemia, OSA on CPAP, morbid obesity, presents for follow-up today.  He was last seen in clinic 09/22/2020 by Dr.Agbor-Etang.  At that time he had complaints of lower extremity edema.  Patient states he has had lower extremity edema for the past 2 years.  He had recently been started on Lasix with some improvement.  He was also compliance with his CPAP mask.  States that he was having shortness of breath when he was overexerts himself but denied any chest pain palpitations or history of heart disease.  His furosemide was stopped and he was started on torsemide and, an echocardiogram was ordered.  Unfortunately the echocardiogram that was ordered was not completed.  He returns clinic today    ROS: ***  Studies Reviewed: Marland Kitchen    EKG:  ***  *** Risk Assessment/Calculations:   {Does this patient have ATRIAL FIBRILLATION?:[phone]} No BP recorded.  {Refresh Note OR Click here to enter BP  :1}***       Physical Exam:   VS:  There were no vitals taken for this visit.   Wt Readings from Last 3 Encounters:  09/22/20 (!) 481 lb (218.2 kg)  12/06/19 (!) 492 lb 8 oz (223.4 kg)  12/09/17 (!) 389 lb (176.4 kg)    GEN: Well nourished, well developed in no acute distress NECK: No JVD; No carotid bruits CARDIAC: ***RRR, no murmurs, rubs, gallops RESPIRATORY:  Clear to auscultation without rales, wheezing or rhonchi  ABDOMEN: Soft, non-tender, non-distended EXTREMITIES:  No edema; No deformity   ASSESSMENT AND PLAN: .   ***    {Are you ordering a CV Procedure (e.g. stress test, cath, DCCV, TEE, etc)?   Press F2         :[id number]}  Dispo: ***  Signed, Yamilet Mcfayden, NP

## 2022-12-12 ENCOUNTER — Encounter: Payer: Self-pay | Admitting: Cardiology

## 2022-12-30 ENCOUNTER — Encounter: Payer: Self-pay | Admitting: Cardiology

## 2023-10-17 ENCOUNTER — Ambulatory Visit: Payer: Self-pay | Attending: Cardiology | Admitting: Cardiology
